# Patient Record
Sex: Female | Born: 1970 | Race: Black or African American | Hispanic: No | Marital: Single | Smoking: Never smoker
Health system: Southern US, Community
[De-identification: ages and names within clinical notes are randomized; demographics above are authoritative.]

## PROBLEM LIST (undated history)

## (undated) DIAGNOSIS — I10 Essential (primary) hypertension: Secondary | ICD-10-CM

---

## 2014-06-24 ENCOUNTER — Ambulatory Visit: Payer: Self-pay | Attending: Internal Medicine | Admitting: Internal Medicine

## 2014-06-24 ENCOUNTER — Encounter: Payer: Self-pay | Admitting: Internal Medicine

## 2014-06-24 VITALS — BP 150/90 | HR 78 | Temp 99.0°F | Resp 16 | Wt 111.4 lb

## 2014-06-24 DIAGNOSIS — R197 Diarrhea, unspecified: Secondary | ICD-10-CM | POA: Insufficient documentation

## 2014-06-24 DIAGNOSIS — R1013 Epigastric pain: Secondary | ICD-10-CM

## 2014-06-24 DIAGNOSIS — Z2821 Immunization not carried out because of patient refusal: Secondary | ICD-10-CM | POA: Insufficient documentation

## 2014-06-24 DIAGNOSIS — R030 Elevated blood-pressure reading, without diagnosis of hypertension: Secondary | ICD-10-CM

## 2014-06-24 DIAGNOSIS — IMO0001 Reserved for inherently not codable concepts without codable children: Secondary | ICD-10-CM | POA: Insufficient documentation

## 2014-06-24 DIAGNOSIS — I10 Essential (primary) hypertension: Secondary | ICD-10-CM | POA: Insufficient documentation

## 2014-06-24 DIAGNOSIS — Z139 Encounter for screening, unspecified: Secondary | ICD-10-CM

## 2014-06-24 DIAGNOSIS — Z8249 Family history of ischemic heart disease and other diseases of the circulatory system: Secondary | ICD-10-CM | POA: Insufficient documentation

## 2014-06-24 LAB — COMPLETE METABOLIC PANEL WITH GFR
ALBUMIN: 4.7 g/dL (ref 3.5–5.2)
ALK PHOS: 59 U/L (ref 39–117)
ALT: 16 U/L (ref 0–35)
AST: 17 U/L (ref 0–37)
BUN: 10 mg/dL (ref 6–23)
CALCIUM: 10 mg/dL (ref 8.4–10.5)
CHLORIDE: 105 meq/L (ref 96–112)
CO2: 25 mEq/L (ref 19–32)
Creat: 0.93 mg/dL (ref 0.50–1.10)
GFR, Est African American: 87 mL/min
GFR, Est Non African American: 76 mL/min
Glucose, Bld: 91 mg/dL (ref 70–99)
POTASSIUM: 4.6 meq/L (ref 3.5–5.3)
SODIUM: 141 meq/L (ref 135–145)
TOTAL PROTEIN: 7.4 g/dL (ref 6.0–8.3)
Total Bilirubin: 0.6 mg/dL (ref 0.2–1.2)

## 2014-06-24 LAB — CBC WITH DIFFERENTIAL/PLATELET
BASOS ABS: 0 10*3/uL (ref 0.0–0.1)
BASOS PCT: 1 % (ref 0–1)
Eosinophils Absolute: 0.1 10*3/uL (ref 0.0–0.7)
Eosinophils Relative: 2 % (ref 0–5)
HEMATOCRIT: 41.1 % (ref 36.0–46.0)
Hemoglobin: 13.2 g/dL (ref 12.0–15.0)
Lymphocytes Relative: 40 % (ref 12–46)
Lymphs Abs: 1.7 10*3/uL (ref 0.7–4.0)
MCH: 26 pg (ref 26.0–34.0)
MCHC: 32.1 g/dL (ref 30.0–36.0)
MCV: 81.1 fL (ref 78.0–100.0)
Monocytes Absolute: 0.5 10*3/uL (ref 0.1–1.0)
Monocytes Relative: 11 % (ref 3–12)
NEUTROS ABS: 1.9 10*3/uL (ref 1.7–7.7)
NEUTROS PCT: 46 % (ref 43–77)
Platelets: 312 10*3/uL (ref 150–400)
RBC: 5.07 MIL/uL (ref 3.87–5.11)
RDW: 14.5 % (ref 11.5–15.5)
WBC: 4.2 10*3/uL (ref 4.0–10.5)

## 2014-06-24 LAB — LIPID PANEL
CHOL/HDL RATIO: 2.2 ratio
Cholesterol: 272 mg/dL — ABNORMAL HIGH (ref 0–200)
HDL: 124 mg/dL (ref >39)
LDL CALC: 138 mg/dL — AB (ref 0–99)
Triglycerides: 49 mg/dL (ref <150)
VLDL: 10 mg/dL (ref 0–40)

## 2014-06-24 LAB — HEMOGLOBIN A1C
HEMOGLOBIN A1C: 5.8 % — AB (ref <5.7)
Mean Plasma Glucose: 120 mg/dL — ABNORMAL HIGH (ref <117)

## 2014-06-24 LAB — TSH: TSH: 0.564 u[IU]/mL (ref 0.350–4.500)

## 2014-06-24 MED ORDER — OMEPRAZOLE 20 MG PO CPDR
20.0000 mg | DELAYED_RELEASE_CAPSULE | Freq: Every day | ORAL | Status: DC
Start: 2014-06-24 — End: 2014-09-19

## 2014-06-24 MED ORDER — ALUMINUM-MAGNESIUM-SIMETHICONE 200-200-20 MG/5ML PO SUSP: 30.0000 mL | Freq: Three times a day (TID) | ORAL | Status: DC

## 2014-06-24 NOTE — Progress Notes (Signed)
Patient here to establish care Complains of stomach pains and diarrhea that started about ten months ago Patient also states she has been loosing weight

## 2014-06-24 NOTE — Progress Notes (Signed)
Patient Demographics  Laura Estes, is a 43 y.o. female  NFA:[id number]:[id number]  ION:[id number]  DOB - 08/14/1971  CC:  Chief Complaint  Patient presents with  . Establish Care       HPI: Laura Estes is a 43 y.o. female here today to establish medical care.Patient reports symptoms of dyspepsia bloating occasional reflux, denies any nausea vomiting has been having some loose bowel movement every day he denies any change in the color denies any fever chills, she has tried over-the-counter medications without much improvement , her blood pressure today is elevated her, she does report family history of hypertension, denies any headache dizziness chest and shortness of breath. As per patient she had a [facility] and reported to be normal. Patient has No headache, No chest pain, No abdominal pain - No Nausea, No new weakness tingling or numbness, No Cough - SOB.  No Known Allergies History reviewed. No pertinent past medical history. No current outpatient prescriptions on file prior to visit.   No current facility-administered medications on file prior to visit.   Family History  Problem Relation Age of Onset  . Cancer Mother   . Hypertension Father   . Heart disease Father    History   Social History  . Marital Status: Single    Spouse Name: N/A    Number of Children: N/A  . Years of Education: N/A   Occupational History  . Not on file.   Social History Main Topics  . Smoking status: Never Smoker   . Smokeless tobacco: Not on file  . Alcohol Use: No  . Drug Use: Not on file  . Sexual Activity: Not on file   Other Topics Concern  . Not on file   Social History Narrative  . No narrative on file    Review of Systems: Constitutional: Negative for fever, chills, diaphoresis, activity change, appetite change and fatigue. HENT: Negative for ear pain, nosebleeds, congestion, facial swelling, rhinorrhea, neck pain, neck stiffness and  ear discharge.  Eyes: Negative for pain, discharge, redness, itching and visual disturbance. Respiratory: Negative for cough, choking, chest tightness, shortness of breath, wheezing and stridor.  Cardiovascular: Negative for chest pain, palpitations and leg swelling. Gastrointestinal: Negative for abdominal distention. Genitourinary: Negative for dysuria, urgency, frequency, hematuria, flank pain, decreased urine volume, difficulty urinating and dyspareunia.  Musculoskeletal: Negative for back pain, joint swelling, arthralgia and gait problem. Neurological: Negative for dizziness, tremors, seizures, syncope, facial asymmetry, speech difficulty, weakness, light-headedness, numbness and headaches.  Hematological: Negative for adenopathy. Does not bruise/bleed easily. Psychiatric/Behavioral: Negative for hallucinations, behavioral problems, confusion, dysphoric mood, decreased concentration and agitation.    Objective:   Filed Vitals:   06/24/14 1107  BP: 150/90  Pulse:   Temp:   Resp:     Physical Exam: Constitutional: Patient appears well-developed and well-nourished. No distress. HENT: Normocephalic, atraumatic, External right and left ear normal. Oropharynx is clear and moist.  Eyes: Conjunctivae and EOM are normal. PERRLA, no scleral icterus. Neck: Normal ROM. Neck supple. No JVD. No tracheal deviation. No thyromegaly. CVS: RRR, S1/S2 +, no murmurs, no gallops, no carotid bruit.  Pulmonary: Effort and breath sounds normal, no stridor, rhonchi, wheezes, rales.  Abdominal: Soft. BS +, no distension, tenderness, rebound or guarding.  Musculoskeletal: Normal range of motion. No edema and no tenderness.  Neuro: Alert. Normal reflexes, muscle tone coordination. No cranial nerve deficit. Skin: Skin is warm and dry. No rash noted. Not diaphoretic. No  erythema. No pallor. Psychiatric: Normal mood and affect. Behavior, judgment, thought content normal.  No results found for: WBC, HGB, HCT,  MCV, PLT No results found for: CREATININE, BUN, NA, K, CL, CO2  No results found for: HGBA1C Lipid Panel  No results found for: CHOL, TRIG, HDL, CHOLHDL, VLDL, LDLCALC     Assessment and plan:   1. Dyspepsia Trial of  - aluminum-magnesium hydroxide-simethicone (MAALOX) 200-200-20 MG/5ML SUSP; Take 30 mLs by mouth 4 (four) times daily -  before meals and at bedtime.  Dispense: 1120 mL; Refill: 1 - omeprazole (PRILOSEC) 20 MG capsule; Take 1 capsule (20 mg total) by mouth daily.  Dispense: 30 capsule; Refill: 3  2. Elevated BP I have advised patient for DASH diet, reevaluate on next visit if persistently elevated consider starting on antihypertensive medications.  3. Screening Ordered baseline blood work.  - CBC with Differential - COMPLETE METABOLIC PANEL WITH GFR - TSH - Lipid panel - Vit D  25 hydroxy (rtn osteoporosis monitoring) - Hemoglobin A1c - MM DIGITAL SCREENING BILATERAL; Future       Health Maintenance  -Pap Smear: up-to-date -Mammogram: ordered  -Vaccinations: Patient declined flu shot.   Return in about 3 months (around 09/24/2014) for Dyspepsia, Elevated BP.   Doris CheadleADVANI, Bena Kobel, MD

## 2014-06-24 NOTE — Patient Instructions (Signed)
DASH Eating Plan °DASH stands for "Dietary Approaches to Stop Hypertension." The DASH eating plan is a healthy eating plan that has been shown to reduce high blood pressure (hypertension). Additional health benefits may include reducing the risk of type 2 diabetes mellitus, heart disease, and stroke. The DASH eating plan may also help with weight loss. °WHAT DO I NEED TO KNOW ABOUT THE DASH EATING PLAN? °For the DASH eating plan, you will follow these general guidelines: °· Choose foods with a percent daily value for sodium of less than 5% (as listed on the food label). °· Use salt-free seasonings or herbs instead of table salt or sea salt. °· Check with your health care provider or pharmacist before using salt substitutes. °· Eat lower-sodium products, often labeled as "lower sodium" or "no salt added." °· Eat fresh foods. °· Eat more vegetables, fruits, and low-fat dairy products. °· Choose whole grains. Look for the word "whole" as the first word in the ingredient list. °· Choose fish and skinless chicken or turkey more often than red meat. Limit fish, poultry, and meat to 6 oz (170 g) each day. °· Limit sweets, desserts, sugars, and sugary drinks. °· Choose heart-healthy fats. °· Limit cheese to 1 oz (28 g) per day. °· Eat more home-cooked food and less restaurant, buffet, and fast food. °· Limit fried foods. °· Cook foods using methods other than frying. °· Limit canned vegetables. If you do use them, rinse them well to decrease the sodium. °· When eating at a restaurant, ask that your food be prepared with less salt, or no salt if possible. °WHAT FOODS CAN I EAT? °Seek help from a dietitian for individual calorie needs. °Grains °Whole grain or whole wheat bread. Brown rice. Whole grain or whole wheat pasta. Quinoa, bulgur, and whole grain cereals. Low-sodium cereals. Corn or whole wheat flour tortillas. Whole grain cornbread. Whole grain crackers. Low-sodium crackers. °Vegetables °Fresh or frozen vegetables  (raw, steamed, roasted, or grilled). Low-sodium or reduced-sodium tomato and vegetable juices. Low-sodium or reduced-sodium tomato sauce and paste. Low-sodium or reduced-sodium canned vegetables.  °Fruits °All fresh, canned (in natural juice), or frozen fruits. °Meat and Other Protein Products °Ground beef (85% or leaner), grass-fed beef, or beef trimmed of fat. Skinless chicken or turkey. Ground chicken or turkey. Pork trimmed of fat. All fish and seafood. Eggs. Dried beans, peas, or lentils. Unsalted nuts and seeds. Unsalted canned beans. °Dairy °Low-fat dairy products, such as skim or 1% milk, 2% or reduced-fat cheeses, low-fat ricotta or cottage cheese, or plain low-fat yogurt. Low-sodium or reduced-sodium cheeses. °Fats and Oils °Tub margarines without trans fats. Light or reduced-fat mayonnaise and salad dressings (reduced sodium). Avocado. Safflower, olive, or canola oils. Natural peanut or almond butter. °Other °Unsalted popcorn and pretzels. °The items listed above may not be a complete list of recommended foods or beverages. Contact your dietitian for more options. °WHAT FOODS ARE NOT RECOMMENDED? °Grains °White bread. White pasta. White rice. Refined cornbread. Bagels and croissants. Crackers that contain trans fat. °Vegetables °Creamed or fried vegetables. Vegetables in a cheese sauce. Regular canned vegetables. Regular canned tomato sauce and paste. Regular tomato and vegetable juices. °Fruits °Dried fruits. Canned fruit in light or heavy syrup. Fruit juice. °Meat and Other Protein Products °Fatty cuts of meat. Ribs, chicken wings, bacon, sausage, bologna, salami, chitterlings, fatback, hot dogs, bratwurst, and packaged luncheon meats. Salted nuts and seeds. Canned beans with salt. °Dairy °Whole or 2% milk, cream, half-and-half, and cream cheese. Whole-fat or sweetened yogurt. Full-fat   cheeses or blue cheese. Nondairy creamers and whipped toppings. Processed cheese, cheese spreads, or cheese  curds. °Condiments °Onion and garlic salt, seasoned salt, table salt, and sea salt. Canned and packaged gravies. Worcestershire sauce. Tartar sauce. Barbecue sauce. Teriyaki sauce. Soy sauce, including reduced sodium. Steak sauce. Fish sauce. Oyster sauce. Cocktail sauce. Horseradish. Ketchup and mustard. Meat flavorings and tenderizers. Bouillon cubes. Hot sauce. Tabasco sauce. Marinades. Taco seasonings. Relishes. °Fats and Oils °Butter, stick margarine, lard, shortening, ghee, and bacon fat. Coconut, palm kernel, or palm oils. Regular salad dressings. °Other °Pickles and olives. Salted popcorn and pretzels. °The items listed above may not be a complete list of foods and beverages to avoid. Contact your dietitian for more information. °WHERE CAN I FIND MORE INFORMATION? °National [employer], [employer], and [employer] Institute: www.nhlbi.nih.gov/health/health-topics/topics/dash/ °Document Released: 07/22/2011 Document Revised: 12/17/2013 Document Reviewed: 06/06/2013 °ExitCare® Patient Information ©2015 [employer]. This information is not intended to replace advice given to you by your health care provider. Make sure you discuss any questions you have with your health care provider. ° °

## 2014-06-24 NOTE — Progress Notes (Deleted)
Patient Demographics  Laura Estes Stegner, is a 43 y.o. female  EPP:[id number]:[id number]  [id number]  DOB - 04/02/1971  CC:  Chief Complaint  Patient presents with  . Establish Care       HPI: Laura Estes Guion is a 43 y.o. female here today to establish medical care. Patient has No headache, No chest pain, No abdominal pain - No Nausea, No new weakness tingling or numbness, No Cough - SOB.  Not on File History reviewed. No pertinent past medical history. No current outpatient prescriptions on file prior to visit.   No current facility-administered medications on file prior to visit.   Family History  Problem Relation Age of Onset  . Cancer Mother   . Hypertension Father   . Heart disease Father    History   Social History  . Marital Status: Single    Spouse Name: N/A    Number of Children: N/A  . Years of Education: N/A   Occupational History  . Not on file.   Social History Main Topics  . Smoking status: Never Smoker   . Smokeless tobacco: Not on file  . Alcohol Use: No  . Drug Use: Not on file  . Sexual Activity: Not on file   Other Topics Concern  . Not on file   Social History Narrative  . No narrative on file    Review of Systems: Constitutional: Negative for fever, chills, diaphoresis, activity change, appetite change and fatigue. HENT: Negative for ear pain, nosebleeds, congestion, facial swelling, rhinorrhea, neck pain, neck stiffness and ear discharge.  Eyes: Negative for pain, discharge, redness, itching and visual disturbance. Respiratory: Negative for cough, choking, chest tightness, shortness of breath, wheezing and stridor.  Cardiovascular: Negative for chest pain, palpitations and leg swelling. Gastrointestinal: Negative for abdominal distention. Genitourinary: Negative for dysuria, urgency, frequency, hematuria, flank pain, decreased urine volume, difficulty urinating and dyspareunia.  Musculoskeletal: Negative for back pain, joint swelling,  arthralgia and gait problem. Neurological: Negative for dizziness, tremors, seizures, syncope, facial asymmetry, speech difficulty, weakness, light-headedness, numbness and headaches.  Hematological: Negative for adenopathy. Does not bruise/bleed easily. Psychiatric/Behavioral: Negative for hallucinations, behavioral problems, confusion, dysphoric mood, decreased concentration and agitation.    Objective:   Filed Vitals:   06/24/14 1107  BP: 150/90  Pulse:   Temp:   Resp:     Physical Exam: Constitutional: Patient appears well-developed and well-nourished. No distress. HENT: Normocephalic, atraumatic, External right and left ear normal. Oropharynx is clear and moist.  Eyes: Conjunctivae and EOM are normal. PERRLA, no scleral icterus. Neck: Normal ROM. Neck supple. No JVD. No tracheal deviation. No thyromegaly. CVS: RRR, S1/S2 +, no murmurs, no gallops, no carotid bruit.  Pulmonary: Effort and breath sounds normal, no stridor, rhonchi, wheezes, rales.  Abdominal: Soft. BS +, no distension, tenderness, rebound or guarding.  Musculoskeletal: Normal range of motion. No edema and no tenderness.  Lymphadenopathy: No lymphadenopathy noted, cervical, inguinal or axillary Neuro: Alert. Normal reflexes, muscle tone coordination. No cranial nerve deficit. Skin: Skin is warm and dry. No rash noted. Not diaphoretic. No erythema. No pallor. Psychiatric: Normal mood and affect. Behavior, judgment, thought content normal.  No results found for: WBC, HGB, HCT, MCV, PLT No results found for: CREATININE, BUN, NA, K, CL, CO2  No results found for: HGBA1C Lipid Panel  No results found for: CHOL, TRIG, HDL, CHOLHDL, VLDL, LDLCALC     Assessment and plan:   1. Dyspepsia ***  2. Elevated BP ***  3. Screening ***  Health Maintenance  -Pap Smear: uptodate done 2015  -Mammogram: ordered  -Vaccinations:  Patient declines flu shot    Return in about 3 months (around 09/24/2014) for  Dyspepsia.    The patient was given clear instructions to go to ER or return to medical center if symptoms don't improve, worsen or new problems develop. The patient verbalized understanding. The patient was told to call to get lab results if they haven't heard anything in the next week.     Doris CheadleADVANI, Justis Closser, MD

## 2014-06-25 LAB — VITAMIN D 25 HYDROXY (VIT D DEFICIENCY, FRACTURES): VIT D 25 HYDROXY: 28 ng/mL — AB (ref 30–89)

## 2014-06-27 ENCOUNTER — Telehealth: Payer: Self-pay

## 2014-06-27 NOTE — Telephone Encounter (Signed)
Patient is aware of her lab results 

## 2014-06-27 NOTE — Telephone Encounter (Signed)
-----   Message from Doris Cheadleeepak Advani, MD sent at 06/25/2014  9:28 AM EST ----- Blood work reviewed, noticed low vitamin D, advise patient to start taking OTC 2000 units daily. Also noticed impaired fasting glucose with A1c of 5.8% and elevated cholesterol, advise patient for low carbohydrate and low-fat diet.

## 2014-08-05 ENCOUNTER — Ambulatory Visit: Payer: Self-pay

## 2014-08-19 ENCOUNTER — Ambulatory Visit (HOSPITAL_COMMUNITY)
Admission: RE | Admit: 2014-08-19 | Discharge: 2014-08-19 | Disposition: A | Discharge status: Home / Self Care | Payer: Self-pay | Source: Ambulatory Visit | Attending: Internal Medicine | Admitting: Internal Medicine

## 2014-08-19 DIAGNOSIS — Z139 Encounter for screening, unspecified: Secondary | ICD-10-CM

## 2014-08-21 ENCOUNTER — Other Ambulatory Visit: Payer: Self-pay | Admitting: Internal Medicine

## 2014-08-21 DIAGNOSIS — R928 Other abnormal and inconclusive findings on diagnostic imaging of breast: Secondary | ICD-10-CM

## 2014-09-12 ENCOUNTER — Encounter (HOSPITAL_COMMUNITY): Payer: Self-pay

## 2014-09-12 ENCOUNTER — Ambulatory Visit
Admission: RE | Admit: 2014-09-12 | Discharge: 2014-09-12 | Disposition: A | Discharge status: Home / Self Care | Payer: No Typology Code available for payment source | Source: Ambulatory Visit | Attending: Internal Medicine | Admitting: Internal Medicine

## 2014-09-12 ENCOUNTER — Ambulatory Visit (HOSPITAL_COMMUNITY)
Admission: RE | Admit: 2014-09-12 | Discharge: 2014-09-12 | Disposition: A | Discharge status: Home / Self Care | Payer: Self-pay | Source: Ambulatory Visit | Attending: Obstetrics and Gynecology | Admitting: Obstetrics and Gynecology

## 2014-09-12 VITALS — BP 108/62 | Temp 98.9°F | Ht 61.0 in | Wt 108.2 lb

## 2014-09-12 DIAGNOSIS — R928 Other abnormal and inconclusive findings on diagnostic imaging of breast: Secondary | ICD-10-CM

## 2014-09-12 DIAGNOSIS — Z1239 Encounter for other screening for malignant neoplasm of breast: Secondary | ICD-10-CM

## 2014-09-12 HISTORY — DX: Essential (primary) hypertension: I10

## 2014-09-12 IMAGING — MG MM DIAGNOSTIC UNILATERAL L
2 series · 2 of 2 positions shown · non-contrast
Comparison: [DATE]

CLINICAL DATA: Call back from screening for left breast
calcifications. Patient screening mammogram was her baseline study.

EXAM:
DIGITAL DIAGNOSTIC  LEFT MAMMOGRAM

[L CC]
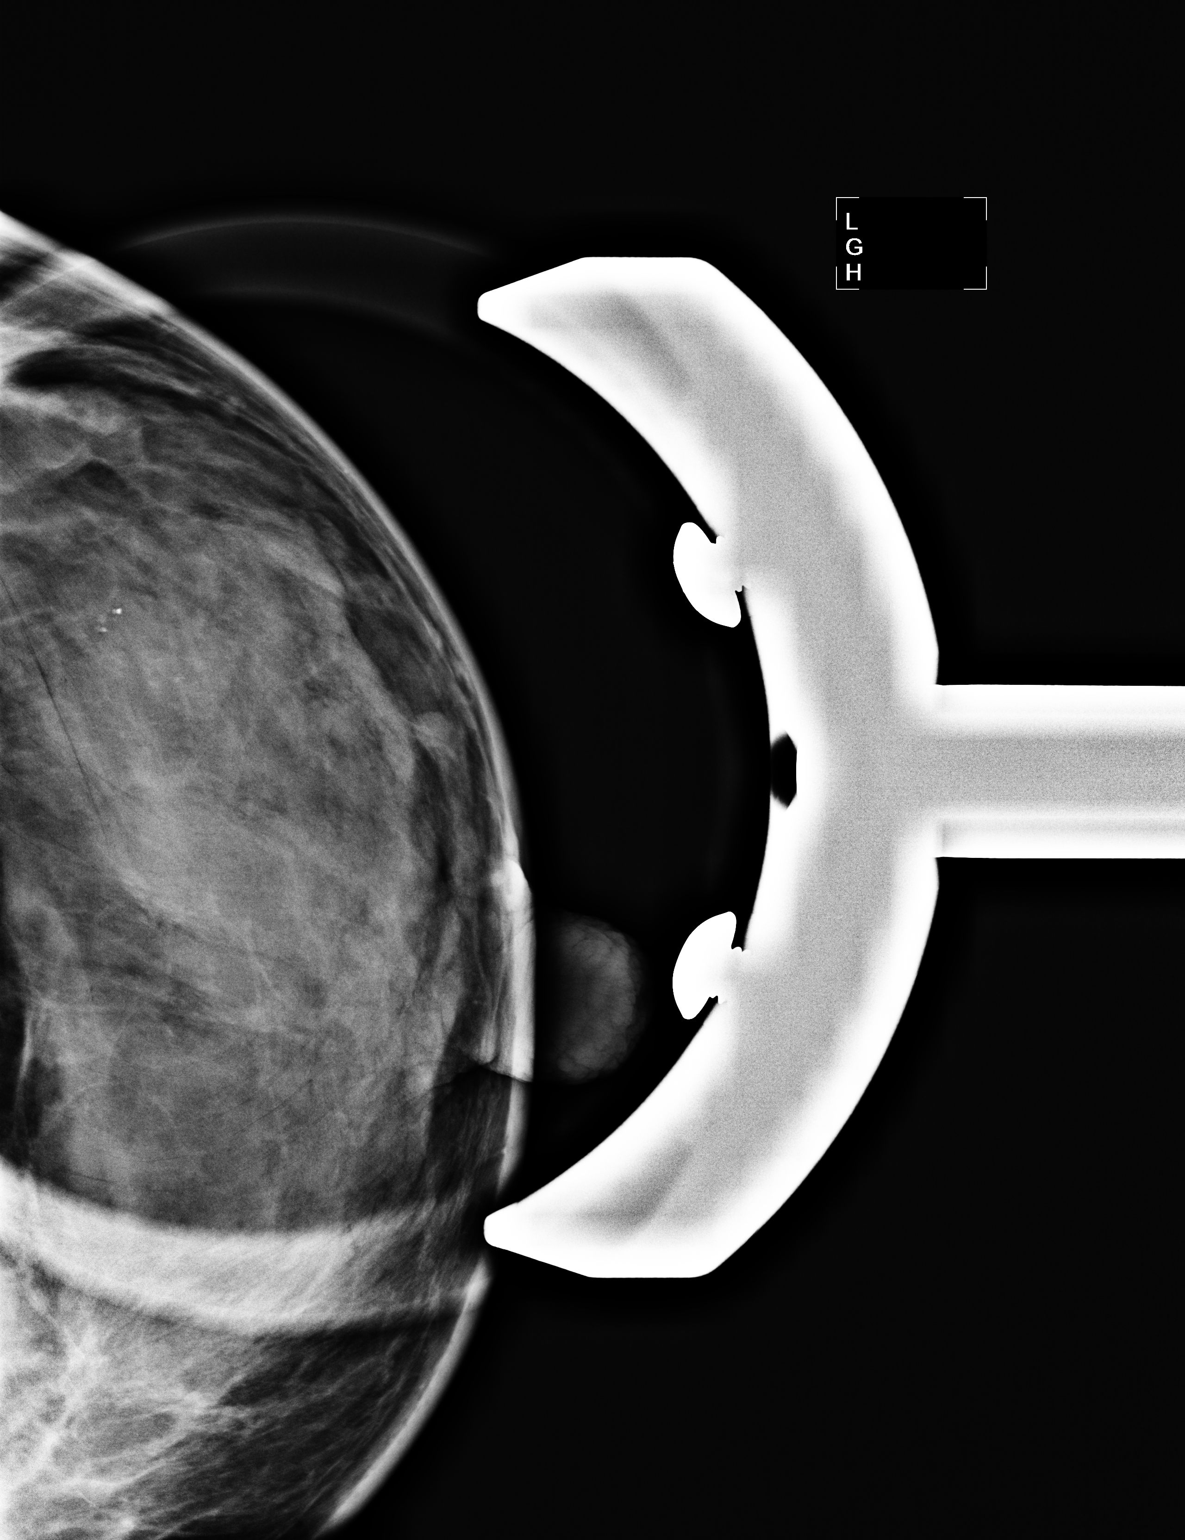

[L ML]
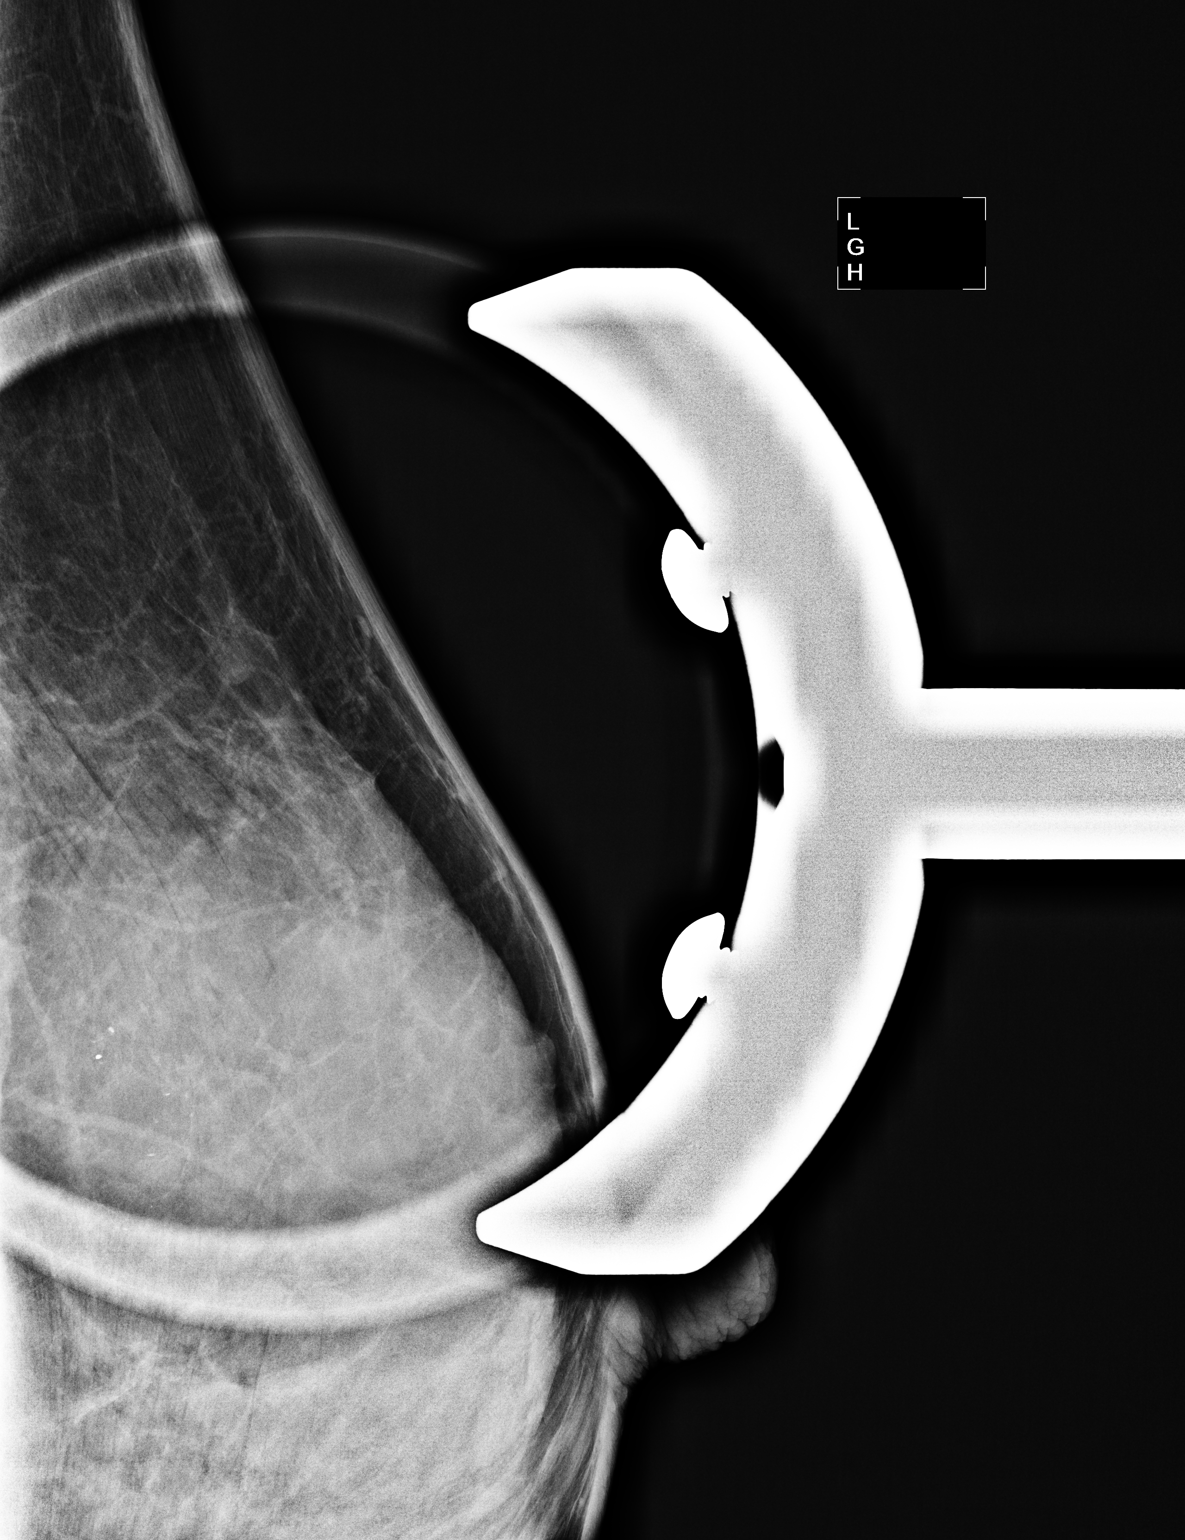

[2 of 2 positions shown; findings below may reference images not displayed]

ACR Breast Density Category c: The breast tissue is heterogeneously
dense, which may obscure small masses.
FINDINGS: On the magnification CC view, most of the calcifications are smudgy
and not well seen. On the ML view, most of the calcifications appear
to layer consistent with mild calcium. Two discrete calcifications
persist, which have typical round smooth benign appearances. These
calcifications are all felt to be benign needing no additional
specific evaluation.
IMPRESSION: Benign left breast calcifications.  No evidence of malignancy.

RECOMMENDATION:
Screening mammogram in one year.(Code:[9B])

I have discussed the findings and recommendations with the patient.
Results were also provided in writing at the conclusion of the
visit. If applicable, a reminder letter will be sent to the patient
regarding the next appointment.

BI-RADS CATEGORY  2: Benign.

## 2014-09-12 NOTE — Patient Instructions (Signed)
Explained to [name] that she did not need a Pap smear today due to last Pap smear was in February 2015 per patient. Let her know [facility] will cover Pap smears every 3 years unless has a history of abnormal Pap smears. [facility] per recommendation. Appointment scheduled for Thursday, September 12, 2014 at 1545. Patient aware of appointment and will be there.  [name] verbalized understanding.  [place], [name], RN 3:02 PM

## 2014-09-12 NOTE — Progress Notes (Signed)
[facility] due to recommending additional imaging of the left breast. Screening mamm[facility].  Pap Smear:  Pap smear not completed today. [facility] and normal per patient. Per patient has no history of an abnormal Pap smear. No Pap smear results in EPIC.  Physical exam: Breasts Breasts symmetrical. No skin abnormalities bilateral breasts. No nipple retraction bilateral breasts. No nipple discharge bilateral breasts. No lymphadenopathy. No lumps palpated bilateral breasts. No complaints of pain or tenderness on exam. [facility] breast diagnostic mamm[facility] per recommendation. Appointment scheduled for Thursday, September 12, 2014 at 1545.  Pelvic/Bimanual No Pap smear completed today since last Pap smear was in February 2015 per patient. Pap smear not indicated per [facility] guidelines.

## 2014-09-19 ENCOUNTER — Ambulatory Visit: Payer: Self-pay | Attending: Internal Medicine | Admitting: Internal Medicine

## 2014-09-19 ENCOUNTER — Encounter: Payer: Self-pay | Admitting: Gastroenterology

## 2014-09-19 ENCOUNTER — Encounter: Payer: Self-pay | Admitting: Internal Medicine

## 2014-09-19 VITALS — BP 130/80 | HR 89 | Temp 98.0°F | Resp 16 | Wt 108.8 lb

## 2014-09-19 DIAGNOSIS — Z79899 Other long term (current) drug therapy: Secondary | ICD-10-CM | POA: Insufficient documentation

## 2014-09-19 DIAGNOSIS — R7301 Impaired fasting glucose: Secondary | ICD-10-CM | POA: Insufficient documentation

## 2014-09-19 DIAGNOSIS — E7800 Pure hypercholesterolemia, unspecified: Secondary | ICD-10-CM

## 2014-09-19 DIAGNOSIS — E559 Vitamin D deficiency, unspecified: Secondary | ICD-10-CM | POA: Insufficient documentation

## 2014-09-19 DIAGNOSIS — R1013 Epigastric pain: Secondary | ICD-10-CM | POA: Insufficient documentation

## 2014-09-19 DIAGNOSIS — R634 Abnormal weight loss: Secondary | ICD-10-CM | POA: Insufficient documentation

## 2014-09-19 MED ORDER — OMEPRAZOLE 40 MG PO CPDR: 40.0000 mg | DELAYED_RELEASE_CAPSULE | Freq: Every day | ORAL | Status: DC

## 2014-09-19 NOTE — Patient Instructions (Addendum)
Diabetes Mellitus and Food It is important for you to manage your blood sugar (glucose) level. Your blood glucose level can be greatly affected by what you eat. Eating healthier foods in the appropriate amounts throughout the day at about the same time each day will help you control your blood glucose level. It can also help slow or prevent worsening of your diabetes mellitus. Healthy eating may even help you improve the level of your blood pressure and reach or maintain a healthy weight.  HOW CAN FOOD AFFECT ME? Carbohydrates Carbohydrates affect your blood glucose level more than any other type of food. Your dietitian will help you determine how many carbohydrates to eat at each meal and teach you how to count carbohydrates. Counting carbohydrates is important to keep your blood glucose at a healthy level, especially if you are using insulin or taking certain medicines for diabetes mellitus. Alcohol Alcohol can cause sudden decreases in blood glucose (hypoglycemia), especially if you use insulin or take certain medicines for diabetes mellitus. Hypoglycemia can be a life-threatening condition. Symptoms of hypoglycemia (sleepiness, dizziness, and disorientation) are similar to symptoms of having too much alcohol.  If your health care provider has given you approval to drink alcohol, do so in moderation and use the following guidelines:  Women should not have more than one drink per day, and men should not have more than two drinks per day. One drink is equal to:  12 oz of beer.  5 oz of wine.  1 oz of hard liquor.  Do not drink on an empty stomach.  Keep yourself hydrated. Have water, diet soda, or unsweetened iced tea.  Regular soda, juice, and other mixers might contain a lot of carbohydrates and should be counted. WHAT FOODS ARE NOT RECOMMENDED? As you make food choices, it is important to remember that all foods are not the same. Some foods have fewer nutrients per serving than other  foods, even though they might have the same number of calories or carbohydrates. It is difficult to get your body what it needs when you eat foods with fewer nutrients. Examples of foods that you should avoid that are high in calories and carbohydrates but low in nutrients include:  Trans fats (most processed foods list trans fats on the Nutrition Facts label).  Regular soda.  Juice.  Candy.  Sweets, such as cake, pie, doughnuts, and cookies.  Fried foods. WHAT FOODS CAN I EAT? Have nutrient-rich foods, which will nourish your body and keep you healthy. The food you should eat also will depend on several factors, including:  The calories you need.  The medicines you take.  Your weight.  Your blood glucose level.  Your blood pressure level.  Your cholesterol level. You also should eat a variety of foods, including:  Protein, such as meat, poultry, fish, tofu, nuts, and seeds (lean animal proteins are best).  Fruits.  Vegetables.  Dairy products, such as milk, cheese, and yogurt (low fat is best).  Breads, grains, pasta, cereal, rice, and beans.  Fats such as olive oil, trans fat-free margarine, canola oil, avocado, and olives. DOES EVERYONE WITH DIABETES MELLITUS HAVE THE SAME MEAL PLAN? Because every person with diabetes mellitus is different, there is not one meal plan that works for everyone. It is very important that you meet with a dietitian who will help you create a meal plan that is just right for you. Document Released: 04/29/2005 Document Revised: 08/07/2013 Document Reviewed: 06/29/2013 Exit[employer] Patient Information 2015 [employer]. This   information is not intended to replace advice given to you by your health care provider. Make sure you discuss any questions you have with your health care provider. Fat and Cholesterol Control Diet Fat and cholesterol levels in your blood and organs are influenced by your diet. High levels of fat and cholesterol may lead to  diseases of the heart, small and large blood vessels, gallbladder, liver, and pancreas. CONTROLLING FAT AND CHOLESTEROL WITH DIET Although exercise and lifestyle factors are important, your diet is key. That is because certain foods are known to raise cholesterol and others to lower it. The goal is to balance foods for their effect on cholesterol and more importantly, to replace saturated and trans fat with other types of fat, such as monounsaturated fat, polyunsaturated fat, and omega-3 fatty acids. On average, a person should consume no more than 15 to 17 g of saturated fat daily. Saturated and trans fats are considered "bad" fats, and they will raise LDL cholesterol. Saturated fats are primarily found in animal products such as meats, butter, and cream. However, that does not mean you need to give up all your favorite foods. Today, there are good tasting, low-fat, low-cholesterol substitutes for most of the things you like to eat. Choose low-fat or nonfat alternatives. Choose round or loin cuts of red meat. These types of cuts are lowest in fat and cholesterol. Chicken (without the skin), fish, veal, and ground turkey breast are great choices. Eliminate fatty meats, such as hot dogs and salami. Even shellfish have little or no saturated fat. Have a 3 oz (85 g) portion when you eat lean meat, poultry, or fish. Trans fats are also called "partially hydrogenated oils." They are oils that have been scientifically manipulated so that they are solid at room temperature resulting in a longer shelf life and improved taste and texture of foods in which they are added. Trans fats are found in stick margarine, some tub margarines, cookies, crackers, and baked goods.  When baking and cooking, oils are a great substitute for butter. The monounsaturated oils are especially beneficial since it is believed they lower LDL and raise HDL. The oils you should avoid entirely are saturated tropical oils, such as coconut and palm.   Remember to eat a lot from food groups that are naturally free of saturated and trans fat, including fish, fruit, vegetables, beans, grains (barley, rice, couscous, bulgur wheat), and pasta (without cream sauces).  IDENTIFYING FOODS THAT LOWER FAT AND CHOLESTEROL  Soluble fiber may lower your cholesterol. This type of fiber is found in fruits such as apples, vegetables such as broccoli, potatoes, and carrots, legumes such as beans, peas, and lentils, and grains such as barley. Foods fortified with plant sterols (phytosterol) may also lower cholesterol. You should eat at least 2 g per day of these foods for a cholesterol lowering effect.  Read package labels to identify low-saturated fats, trans fat free, and low-fat foods at the supermarket. Select cheeses that have only 2 to 3 g saturated fat per ounce. Use a heart-healthy tub margarine that is free of trans fats or partially hydrogenated oil. When buying baked goods (cookies, crackers), avoid partially hydrogenated oils. Breads and muffins should be made from whole grains (whole-wheat or whole oat flour, instead of "flour" or "enriched flour"). Buy non-creamy canned soups with reduced salt and no added fats.  FOOD PREPARATION TECHNIQUES  Never deep-fry. If you must fry, either stir-fry, which uses very little fat, or use non-stick cooking sprays. When possible, broil,   bake, or roast meats, and steam vegetables. Instead of putting butter or margarine on vegetables, use lemon and herbs, applesauce, and cinnamon (for squash and sweet potatoes). Use nonfat yogurt, salsa, and low-fat dressings for salads.  LOW-SATURATED FAT / LOW-FAT FOOD SUBSTITUTES Meats / Saturated Fat (g)  Avoid: Steak, marbled (3 oz/85 g) / 11 g  Choose: Steak, lean (3 oz/85 g) / 4 g  Avoid: Hamburger (3 oz/85 g) / 7 g  Choose: Hamburger, lean (3 oz/85 g) / 5 g  Avoid: Ham (3 oz/85 g) / 6 g  Choose: Ham, lean cut (3 oz/85 g) / 2.4 g  Avoid: Chicken, with skin, dark meat (3  oz/85 g) / 4 g  Choose: Chicken, skin removed, dark meat (3 oz/85 g) / 2 g  Avoid: Chicken, with skin, light meat (3 oz/85 g) / 2.5 g  Choose: Chicken, skin removed, light meat (3 oz/85 g) / 1 g Dairy / Saturated Fat (g)  Avoid: Whole milk (1 cup) / 5 g  Choose: Low-fat milk, 2% (1 cup) / 3 g  Choose: Low-fat milk, 1% (1 cup) / 1.5 g  Choose: Skim milk (1 cup) / 0.3 g  Avoid: Hard cheese (1 oz/28 g) / 6 g  Choose: Skim milk cheese (1 oz/28 g) / 2 to 3 g  Avoid: Cottage cheese, 4% fat (1 cup) / 6.5 g  Choose: Low-fat cottage cheese, 1% fat (1 cup) / 1.5 g  Avoid: Ice cream (1 cup) / 9 g  Choose: Sherbet (1 cup) / 2.5 g  Choose: Nonfat frozen yogurt (1 cup) / 0.3 g  Choose: Frozen fruit bar / trace  Avoid: Whipped cream (1 tbs) / 3.5 g  Choose: Nondairy whipped topping (1 tbs) / 1 g Condiments / Saturated Fat (g)  Avoid: Mayonnaise (1 tbs) / 2 g  Choose: Low-fat mayonnaise (1 tbs) / 1 g  Avoid: Butter (1 tbs) / 7 g  Choose: Extra light margarine (1 tbs) / 1 g  Avoid: Coconut oil (1 tbs) / 11.8 g  Choose: Olive oil (1 tbs) / 1.8 g  Choose: Corn oil (1 tbs) / 1.7 g  Choose: Safflower oil (1 tbs) / 1.2 g  Choose: Sunflower oil (1 tbs) / 1.4 g  Choose: Soybean oil (1 tbs) / 2.4 g  Choose: Canola oil (1 tbs) / 1 g Document Released: 08/02/2005 Document Revised: 11/27/2012 Document Reviewed: 10/31/2013 Exit[employer] Patient Information 2015 [employer]. This information is not intended to replace advice given to you by your health care provider. Make sure you discuss any questions you have with your health care provider.  

## 2014-09-19 NOTE — Progress Notes (Signed)
Patient here for follow up Patient concerned she is loosing weight Still having loose bowels and stomach is still bothering her

## 2014-09-19 NOTE — Progress Notes (Signed)
MRN: [id number] Name: Laura Estes  Sex: female Age: 44 y.o. DOB: 03/13/1971  Allergies: Review of patient's allergies indicates no known allergies.  Chief Complaint  Patient presents with  . Follow-up    HPI: Patient is 44 y.o. female who her history of GERD/dyspepsia comes today complaining of symptoms not improved since she was prescribed Maalox and Prilosec 20 mg on the last visit, she does report weight loss, her bowel movements have always been soft, previous blood work reviewed with the patient noticed impaired fasting glucose and elevated cholesterol as well as vitamin D deficiency. Initially today her blood pressure was elevated, repeat manual blood pressure is 130/80. Patient has been trying to modify her diet and low salt.  Past Medical History  Diagnosis Date  . Hypertension     History reviewed. No pertinent past surgical history.    Medication List       This list is accurate as of: 09/19/14  9:51 AM.  Always use your most recent med list.               aluminum-magnesium hydroxide-simethicone 200-200-20 MG/5ML Susp  Commonly known as:  MAALOX  Take 30 mLs by mouth 4 (four) times daily -  before meals and at bedtime.     omeprazole 40 MG capsule  Commonly known as:  PRILOSEC  Take 1 capsule (40 mg total) by mouth daily.        Meds ordered this encounter  Medications  . omeprazole (PRILOSEC) 40 MG capsule    Sig: Take 1 capsule (40 mg total) by mouth daily.    Dispense:  30 capsule    Refill:  3     There is no immunization history on file for this patient.  Family History  Problem Relation Age of Onset  . Cancer Mother     lung  . Hypertension Father   . Heart disease Father   . Cancer Maternal Grandmother     ovarian and uterine    History  Substance Use Topics  . Smoking status: Never Smoker   . Smokeless tobacco: Never Used  . Alcohol Use: No    Review of Systems   As noted in HPI  Filed Vitals:   09/19/14 0941  BP:  130/80  Pulse:   Temp:   Resp:     Physical Exam  Physical Exam  Constitutional: No distress.  Eyes: EOM are normal. Pupils are equal, round, and reactive to light.  Cardiovascular: Normal rate and regular rhythm.   Pulmonary/Chest: Breath sounds normal. No respiratory distress. She has no wheezes. She has no rales.  Abdominal: She exhibits no distension. There is no tenderness. There is no rebound.  No CVA tenderness   Musculoskeletal: She exhibits no edema.    CBC    Component Value Date/Time   WBC 4.2 06/24/2014 1115   RBC 5.07 06/24/2014 1115   HGB 13.2 06/24/2014 1115   HCT 41.1 06/24/2014 1115   PLT 312 06/24/2014 1115   MCV 81.1 06/24/2014 1115   LYMPHSABS 1.7 06/24/2014 1115   MONOABS 0.5 06/24/2014 1115   EOSABS 0.1 06/24/2014 1115   BASOSABS 0.0 06/24/2014 1115    CMP     Component Value Date/Time   NA 141 06/24/2014 1115   K 4.6 06/24/2014 1115   CL 105 06/24/2014 1115   CO2 25 06/24/2014 1115   GLUCOSE 91 06/24/2014 1115   BUN 10 06/24/2014 1115   CREATININE 0.93 06/24/2014 1115   CALCIUM  10.0 06/24/2014 1115   PROT 7.4 06/24/2014 1115   ALBUMIN 4.7 06/24/2014 1115   AST 17 06/24/2014 1115   ALT 16 06/24/2014 1115   ALKPHOS 59 06/24/2014 1115   BILITOT 0.6 06/24/2014 1115   GFRNONAA 76 06/24/2014 1115   GFRAA 87 06/24/2014 1115    Lab Results  Component Value Date/Time   CHOL 272* 06/24/2014 11:15 AM    No components found for: HGA1C  Lab Results  Component Value Date/Time   AST 17 06/24/2014 11:15 AM    Assessment and Plan  Dyspepsia /Loss of weight - Plan: have increased the dose of Prilosec omeprazole (PRILOSEC) 40 MG capsule, US Abdomen Complete, Ambulatory referral to Gastroenterology  IFG (impaired fasting glucose) Advised patient for low carbohydrate diet.  High cholesterol Advised patient for low fat diet.  Vitamin D insufficiency Patient will start taking over-the-counter vitamin D 2000 units daily.  Health  Maintenance  -Pap Smear: will be scheduled  -Mammogram: uptodate  -Vaccinations:  -patient declines flu shot   Return in about 3 months (around 12/18/2014), or if symptoms worsen or fail to improve, for gerd, Schedule Appt with Dr Glendora Score for PAP.  Doris Cheadle, MD

## 2014-09-26 ENCOUNTER — Ambulatory Visit: Payer: Self-pay | Admitting: Family Medicine

## 2014-09-26 ENCOUNTER — Encounter: Payer: Self-pay | Admitting: Family Medicine

## 2014-09-26 ENCOUNTER — Other Ambulatory Visit (HOSPITAL_COMMUNITY)
Admission: RE | Admit: 2014-09-26 | Discharge: 2014-09-26 | Disposition: A | Discharge status: Home / Self Care | Payer: Self-pay | Source: Ambulatory Visit | Attending: Family Medicine | Admitting: Family Medicine

## 2014-09-26 ENCOUNTER — Ambulatory Visit: Payer: Self-pay | Attending: Family Medicine | Admitting: Family Medicine

## 2014-09-26 ENCOUNTER — Ambulatory Visit (HOSPITAL_COMMUNITY)
Admission: RE | Admit: 2014-09-26 | Discharge: 2014-09-26 | Disposition: A | Discharge status: Home / Self Care | Payer: Self-pay | Source: Ambulatory Visit | Attending: Internal Medicine | Admitting: Internal Medicine

## 2014-09-26 VITALS — BP 121/75 | HR 96 | Temp 98.0°F | Resp 16 | Ht 61.0 in | Wt 109.0 lb

## 2014-09-26 DIAGNOSIS — N760 Acute vaginitis: Secondary | ICD-10-CM | POA: Insufficient documentation

## 2014-09-26 DIAGNOSIS — R1013 Epigastric pain: Secondary | ICD-10-CM | POA: Insufficient documentation

## 2014-09-26 DIAGNOSIS — R1030 Lower abdominal pain, unspecified: Secondary | ICD-10-CM

## 2014-09-26 DIAGNOSIS — N951 Menopausal and female climacteric states: Secondary | ICD-10-CM | POA: Insufficient documentation

## 2014-09-26 DIAGNOSIS — Z1151 Encounter for screening for human papillomavirus (HPV): Secondary | ICD-10-CM | POA: Insufficient documentation

## 2014-09-26 DIAGNOSIS — Z113 Encounter for screening for infections with a predominantly sexual mode of transmission: Secondary | ICD-10-CM | POA: Insufficient documentation

## 2014-09-26 DIAGNOSIS — R232 Flushing: Secondary | ICD-10-CM | POA: Insufficient documentation

## 2014-09-26 DIAGNOSIS — L509 Urticaria, unspecified: Secondary | ICD-10-CM | POA: Insufficient documentation

## 2014-09-26 DIAGNOSIS — Z124 Encounter for screening for malignant neoplasm of cervix: Secondary | ICD-10-CM | POA: Insufficient documentation

## 2014-09-26 DIAGNOSIS — N912 Amenorrhea, unspecified: Secondary | ICD-10-CM | POA: Insufficient documentation

## 2014-09-26 DIAGNOSIS — B9689 Other specified bacterial agents as the cause of diseases classified elsewhere: Secondary | ICD-10-CM

## 2014-09-26 DIAGNOSIS — A499 Bacterial infection, unspecified: Secondary | ICD-10-CM

## 2014-09-26 DIAGNOSIS — Z01419 Encounter for gynecological examination (general) (routine) without abnormal findings: Secondary | ICD-10-CM | POA: Insufficient documentation

## 2014-09-26 LAB — POCT URINE PREGNANCY: PREG TEST UR: NEGATIVE

## 2014-09-26 MED ORDER — LORATADINE 10 MG PO TABS: 10.0000 mg | ORAL_TABLET | Freq: Every day | ORAL | Status: DC

## 2014-09-26 NOTE — Progress Notes (Signed)
   Subjective:    Patient ID: Laura DakinKiki Estes, female    DOB: 05/04/1971, 44 y.o.   MRN: [id number] CC: Here for Pap smear PCP Dr. Valrie HartAdavani  HPI 44 year old female:  1. Pap smear: No history of abnormal periods. She denies vaginal discharge bleeding. She's no longer having menstrual periods. She is having hot flashes and would like some information about treatment for hot flashes.  2. Health maintenance: Patient recently had a normal screening mammogram.  Soc Hx: non smoker   Review of Systems As per HPI  Hot flashes    Objective:   Physical Exam BP 121/75 mmHg  Pulse 96  Temp(Src) 98 F (36.7 C)  Resp 16  Ht 5\' 1"  (1.549 m)  Wt 109 lb (49.442 kg)  BMI 20.61 kg/m2  SpO2 99% General appearance: alert, cooperative and no distress  Skin: cluster of erythematous papules on L collar bone. Sweating noted.  Breasts: normal appearance, no masses or tenderness, Inspection negative, No nipple retraction or dimpling, No nipple discharge or bleeding, No axillary or supraclavicular adenopathy Abdomen: soft flat, non tender Pelvic: cervix normal in appearance, external genitalia normal, no adnexal masses or tenderness, no cervical motion tenderness, rectovaginal septum normal, uterus normal size, shape, and consistency and vagina normal without discharge   Vaginal bleeding noted while performing pap smear   U preg: Negative     Assessment & Plan:

## 2014-09-26 NOTE — Patient Instructions (Signed)
[name],  Very nice to meet you.  1. Pap and breast exam done today. You will be called with results.   2. Hives: Claritin 10 mg daily   3. Hot flashes: follow up with Dr. Orpah CobbAdvani to discuss treatment options  You will be called with pap results  F/u with Dr. Orpah CobbAdvani for hot flashes at your earliest convenience  Dr. Armen PickupFunches

## 2014-09-26 NOTE — Progress Notes (Signed)
Pap smear Patient c/o abdominal pain Patient c/o night sweats and has had no period since 11/2013

## 2014-09-27 ENCOUNTER — Telehealth: Payer: Self-pay | Admitting: *Deleted

## 2014-09-27 DIAGNOSIS — B9689 Other specified bacterial agents as the cause of diseases classified elsewhere: Secondary | ICD-10-CM | POA: Insufficient documentation

## 2014-09-27 DIAGNOSIS — N760 Acute vaginitis: Secondary | ICD-10-CM

## 2014-09-27 LAB — CERVICOVAGINAL ANCILLARY ONLY
CHLAMYDIA, DNA PROBE: NEGATIVE
Neisseria Gonorrhea: NEGATIVE
WET PREP (BD AFFIRM): NEGATIVE
Wet Prep (BD Affirm): NEGATIVE
Wet Prep (BD Affirm): POSITIVE — AB

## 2014-09-27 MED ORDER — METRONIDAZOLE 500 MG PO TABS: 500.0000 mg | ORAL_TABLET | Freq: Two times a day (BID) | ORAL | Status: DC

## 2014-09-27 MED ORDER — FLUCONAZOLE 150 MG PO TABS: 150.0000 mg | ORAL_TABLET | Freq: Once | ORAL | Status: DC

## 2014-09-27 NOTE — Telephone Encounter (Signed)
Left voice message to return call 

## 2014-09-27 NOTE — Assessment & Plan Note (Signed)
A:negative urine pregnancy. History consistent with perimenopausal state.   P: Reassurance PCP follow-up to discuss treatment for hot flashes.

## 2014-09-27 NOTE — Telephone Encounter (Signed)
Pt. Aware of lab results.

## 2014-09-27 NOTE — Assessment & Plan Note (Signed)
Noted on wet prep. Will treat with Flagyl and Diflucan.

## 2014-09-27 NOTE — Assessment & Plan Note (Signed)
Screening Pap smear done.

## 2014-09-27 NOTE — Assessment & Plan Note (Signed)
A: Noted on physical exam. Triggered by sweating. P: Prescribed antihistamine.

## 2014-09-27 NOTE — Telephone Encounter (Signed)
-----   Message from Lora PaulaJosalyn C Funches, MD sent at 09/27/2014  8:51 AM EST ----- Bacteria vaginosis noted on wet prep otherwise negative, negative GC chlamydia. Will treat with Flagyl and Diflucan.

## 2014-10-03 ENCOUNTER — Telehealth: Payer: Self-pay | Admitting: *Deleted

## 2014-10-03 NOTE — Telephone Encounter (Signed)
Pt aware of results 

## 2014-10-03 NOTE — Telephone Encounter (Signed)
-----   Message from Lora PaulaJosalyn C Funches, MD sent at 10/03/2014 10:44 AM EST ----- Negative pap, repeat in 5 years

## 2014-10-07 LAB — CYTOLOGY - PAP

## 2014-10-10 ENCOUNTER — Telehealth: Payer: Self-pay

## 2014-10-10 ENCOUNTER — Ambulatory Visit: Payer: Self-pay | Admitting: Nurse Practitioner

## 2014-10-10 NOTE — Telephone Encounter (Signed)
-----   Message from Doris Cheadleeepak Advani, MD sent at 09/27/2014  9:19 AM EST ----- Call and let the patient know that her abdominal ultrasound is reported to be normal.  FINDINGS: Gallbladder: No gallstones, gallbladder wall thickening, or pericholecystic fluid. Negative sonographic Murphy's sign.

## 2014-10-10 NOTE — Telephone Encounter (Signed)
Patient not available Left message on voice mail that her US was normal

## 2014-12-18 ENCOUNTER — Ambulatory Visit: Payer: Self-pay | Attending: Internal Medicine

## 2015-08-19 ENCOUNTER — Other Ambulatory Visit: Payer: Self-pay

## 2015-08-19 DIAGNOSIS — Z1231 Encounter for screening mammogram for malignant neoplasm of breast: Secondary | ICD-10-CM

## 2015-09-10 ENCOUNTER — Ambulatory Visit
Admission: RE | Admit: 2015-09-10 | Discharge: 2015-09-10 | Disposition: A | Discharge status: Home / Self Care | Payer: No Typology Code available for payment source | Source: Ambulatory Visit

## 2015-09-10 DIAGNOSIS — Z1231 Encounter for screening mammogram for malignant neoplasm of breast: Secondary | ICD-10-CM

## 2016-11-16 ENCOUNTER — Other Ambulatory Visit: Payer: Self-pay | Admitting: Obstetrics and Gynecology

## 2016-11-16 DIAGNOSIS — Z1231 Encounter for screening mammogram for malignant neoplasm of breast: Secondary | ICD-10-CM

## 2016-11-30 ENCOUNTER — Encounter (HOSPITAL_COMMUNITY): Payer: Self-pay | Admitting: *Deleted

## 2016-11-30 ENCOUNTER — Ambulatory Visit
Admission: RE | Admit: 2016-11-30 | Discharge: 2016-11-30 | Disposition: A | Discharge status: Home / Self Care | Payer: No Typology Code available for payment source | Source: Ambulatory Visit | Attending: Obstetrics and Gynecology | Admitting: Obstetrics and Gynecology

## 2016-11-30 ENCOUNTER — Ambulatory Visit (HOSPITAL_COMMUNITY)
Admission: RE | Admit: 2016-11-30 | Discharge: 2016-11-30 | Disposition: A | Discharge status: Home / Self Care | Payer: Self-pay | Source: Ambulatory Visit | Attending: Obstetrics and Gynecology | Admitting: Obstetrics and Gynecology

## 2016-11-30 VITALS — BP 120/82 | Temp 98.8°F | Ht 61.5 in | Wt 105.6 lb

## 2016-11-30 DIAGNOSIS — Z1239 Encounter for other screening for malignant neoplasm of breast: Secondary | ICD-10-CM

## 2016-11-30 DIAGNOSIS — Z1231 Encounter for screening mammogram for malignant neoplasm of breast: Secondary | ICD-10-CM

## 2016-11-30 NOTE — Progress Notes (Signed)
No complaints today.   Pap Smear: Pap smear not completed today. Last Pap smear was 09/26/2014 at [facility] and normal with negative HPV. Per patient has no history of an abnormal Pap smear.   Physical exam: Breasts Breasts symmetrical. No skin abnormalities bilateral breasts. No nipple retraction bilateral breasts. No nipple discharge bilateral breasts. No lymphadenopathy. No lumps palpated bilateral breasts. No complaints of pain or tenderness on exam. Referred patient to the [facility] for a screening mammogram. Appointment scheduled for Tuesday, November 30, 2016 at 1410.        Pelvic/Bimanual No Pap smear completed today since last Pap smear and HPV typing was 09/26/2014. Pap smear not indicated per BCCCP guidelines.   Smoking History: Patient has never smoked.  Patient Navigation: Patient education provided. Access to services provided for patient through [facility] program.

## 2016-11-30 NOTE — Patient Instructions (Signed)
Explained breast self awareness with Laura Estes. Patient did not need a Pap smear today due to last Pap smear and HPV typing was 09/26/2014. Let her know BCCCP will cover Pap smears and HPV typing every 5 years unless has a history of abnormal Pap smears. Referred patient to the [facility] for a screening mammogram. Appointment scheduled for Tuesday, November 30, 2016 at 1410. Let patient know the Breast Center will follow up with her within the next couple weeks with results of mammogram by letter or phone. Laura Estes verbalized understanding.  Laura Estes, Laura Maser, RN 3:06 PM

## 2016-12-03 ENCOUNTER — Encounter (HOSPITAL_COMMUNITY): Payer: Self-pay | Admitting: *Deleted

## 2018-01-25 ENCOUNTER — Other Ambulatory Visit: Payer: Self-pay

## 2018-01-25 DIAGNOSIS — Z1231 Encounter for screening mammogram for malignant neoplasm of breast: Secondary | ICD-10-CM

## 2018-03-08 ENCOUNTER — Other Ambulatory Visit: Payer: Self-pay | Admitting: Obstetrics and Gynecology

## 2018-03-08 DIAGNOSIS — Z1231 Encounter for screening mammogram for malignant neoplasm of breast: Secondary | ICD-10-CM

## 2018-03-28 ENCOUNTER — Encounter (HOSPITAL_COMMUNITY): Payer: Self-pay

## 2018-03-28 ENCOUNTER — Ambulatory Visit (HOSPITAL_COMMUNITY)
Admission: RE | Admit: 2018-03-28 | Discharge: 2018-03-28 | Disposition: A | Discharge status: Home / Self Care | Payer: No Typology Code available for payment source | Source: Ambulatory Visit | Attending: Obstetrics and Gynecology | Admitting: Obstetrics and Gynecology

## 2018-03-28 ENCOUNTER — Ambulatory Visit
Admission: RE | Admit: 2018-03-28 | Discharge: 2018-03-28 | Disposition: A | Discharge status: Home / Self Care | Payer: No Typology Code available for payment source | Source: Ambulatory Visit | Attending: Obstetrics and Gynecology | Admitting: Obstetrics and Gynecology

## 2018-03-28 VITALS — BP 128/74 | Ht 61.0 in

## 2018-03-28 DIAGNOSIS — Z1231 Encounter for screening mammogram for malignant neoplasm of breast: Secondary | ICD-10-CM

## 2018-03-28 DIAGNOSIS — Z1239 Encounter for other screening for malignant neoplasm of breast: Secondary | ICD-10-CM

## 2018-03-28 NOTE — Progress Notes (Signed)
No complaints today.   Pap Smear: Pap smear not completed today. [facility] and normal with negative HPV. Per patient has no history of an abnormal Pap smear.   Physical exam: Breasts Breasts symmetrical. No skin abnormalities bilateral breasts. No nipple retraction bilateral breasts. No nipple discharge bilateral breasts. No lymphadenopathy. No lumps palpated bilateral breasts. No complaints of pain or tenderness on exam. [facility] for [facility] screening mamm[facility]. Appointment scheduled for Tuesday, March 28, 2018 at 1240.        Pelvic/Bimanual No Pap smear completed today since last Pap smear and HPV typing was 09/26/2014. Pap smear not indicated per BCCCP guidelines.   Smoking History: Patient has never smoked.  Patient Navigation: Patient education provided. Access to services provided for patient through BCCCP program.   Breast and Cervical Cancer Risk Assessment: Patient has a family history of a maternal aunt and a maternal cousin with breast cancer. Patient has no known genetic mutations or history of radiation treatment to the chest before age 48. Patient has no history of cervical dysplasia, immunocompromised, or DES exposure in-utero.  Risk Assessment    Risk Scores      03/28/2018   Last edited by: Priscille HeidelbergBrannock, Mailani Degroote P, RN   5-year risk: 1 %   Lifetime risk: 9 %

## 2018-03-28 NOTE — Patient Instructions (Addendum)
Explained breast self awareness with Laura Estes. Patient did not need a Pap smear today due to last Pap smear and HPV typing was 09/26/2014. Let her know BCCCP will cover Pap smears and HPV typing every 5 years unless has a history of abnormal Pap smears. [facility] for [facility] screening mamm[facility]. Appointment scheduled for Tuesday, March 28, 2018 at 1240. Let patient know the Breast Center will follow up with her within the next couple weeks with results of mammogram by letter or phone. Laura Estes verbalized understanding.  Laura Estes, [name], RN 12:18 PM

## 2018-07-27 ENCOUNTER — Encounter (HOSPITAL_COMMUNITY): Payer: Self-pay

## 2018-07-27 ENCOUNTER — Other Ambulatory Visit: Payer: Self-pay

## 2018-07-27 ENCOUNTER — Ambulatory Visit (HOSPITAL_COMMUNITY)
Admission: EM | Admit: 2018-07-27 | Discharge: 2018-07-27 | Disposition: A | Discharge status: Home / Self Care | Payer: No Typology Code available for payment source | Attending: Family Medicine | Admitting: Family Medicine

## 2018-07-27 DIAGNOSIS — Z79899 Other long term (current) drug therapy: Secondary | ICD-10-CM | POA: Insufficient documentation

## 2018-07-27 DIAGNOSIS — R319 Hematuria, unspecified: Secondary | ICD-10-CM

## 2018-07-27 DIAGNOSIS — N390 Urinary tract infection, site not specified: Secondary | ICD-10-CM | POA: Insufficient documentation

## 2018-07-27 DIAGNOSIS — R0789 Other chest pain: Secondary | ICD-10-CM

## 2018-07-27 DIAGNOSIS — R0602 Shortness of breath: Secondary | ICD-10-CM

## 2018-07-27 LAB — POCT URINALYSIS DIP (DEVICE)
Glucose, UA: NEGATIVE mg/dL
Leukocytes, UA: NEGATIVE
Nitrite: POSITIVE — AB
PH: 5.5 (ref 5.0–8.0)
Protein, ur: 100 mg/dL — AB
Specific Gravity, Urine: >=1.03 (ref 1.005–1.030)
Urobilinogen, UA: 0.2 mg/dL (ref 0.0–1.0)

## 2018-07-27 LAB — POCT I-STAT, CHEM 8
BUN: 10 mg/dL (ref 6–20)
Calcium, Ion: 1.16 mmol/L (ref 1.15–1.40)
Chloride: 109 mmol/L (ref 98–111)
Creatinine, Ser: 1 mg/dL (ref 0.44–1.00)
GLUCOSE: 100 mg/dL — AB (ref 70–99)
HEMATOCRIT: 45 % (ref 36.0–46.0)
HEMOGLOBIN: 15.3 g/dL — AB (ref 12.0–15.0)
Potassium: 3.7 mmol/L (ref 3.5–5.1)
SODIUM: 142 mmol/L (ref 135–145)
TCO2: 25 mmol/L (ref 22–32)

## 2018-07-27 MED ORDER — CEPHALEXIN 500 MG PO CAPS: 500.0000 mg | ORAL_CAPSULE | Freq: Two times a day (BID) | ORAL | 0 refills | Status: DC

## 2018-07-27 NOTE — Discharge Instructions (Signed)
Your urine was positive for urinary tract infection We will treat this with Keflex twice a day for the next 7 days Make sure you are staying hydrated with plenty of fluids, water, Gatorade. Your blood work was normal I have not received your vitamin D level yet we will call you if this is low and needs to be treated I am giving you a contact for a primary care provider to follow-up with for continued or worsening symptoms

## 2018-07-27 NOTE — ED Provider Notes (Signed)
[facility]    CSN: [id number] Arrival date & time: 07/27/18  2595     History   Chief Complaint Chief Complaint  Patient presents with  . Leg Pain    HPI Laura Estes is a 47 y.o. female.   Patient is a 48 year old female presents with bilateral lower extremity muscle cramping, back pain, lower abdominal pain, low-grade fever, headaches.  This is been waxing and waning over the past 4 days. She has not been taking anything for her symptoms. Pt anxious and tearful. She is also complaining of tingling in the fingers, chest pain and SOB. No recent traveling, she does not smoke.  She denies any associated dysuria, hematuria, urinary frequency, vaginal discharge.  She denies any nausea, vomiting, diarrhea.  She is not currently sexually active.  She is menopausal.   ROS per HPI      Past Medical History:  Diagnosis Date  . Hypertension     Patient Active Problem List   Diagnosis Date Noted  . Bacterial vaginosis 09/27/2014  . Perimenopausal 09/26/2014  . Pap smear for cervical cancer screening 09/26/2014  . Urticaria 09/26/2014  . Dyspepsia 06/24/2014  . Elevated BP 06/24/2014    History reviewed. No pertinent surgical history.  OB History    Gravida  1   Para      Term      Preterm      AB  1   Living  0     SAB      TAB  1   Ectopic      Multiple      Live Births  0            Home Medications    Prior to Admission medications   Medication Sig Start Date End Date Taking? Authorizing Provider  aluminum-magnesium hydroxide-simethicone (MAALOX) 200-200-20 MG/5ML SUSP Take 30 mLs by mouth 4 (four) times daily -  before meals and at bedtime. Patient not taking: Reported on 11/30/2016 06/24/14   Doris Cheadle, MD  cephALEXin (KEFLEX) 500 MG capsule Take 1 capsule (500 mg total) by mouth 2 (two) times daily. 07/27/18   Dahlia Byes A, NP  fluconazole (DIFLUCAN) 150 MG tablet Take 1 tablet (150 mg total) by mouth once. Patient not  taking: Reported on 11/30/2016 09/27/14   Dessa Phi, MD  loratadine (CLARITIN) 10 MG tablet Take 1 tablet (10 mg total) by mouth daily. 09/26/14   Funches, Gerilyn Nestle, MD  metroNIDAZOLE (FLAGYL) 500 MG tablet Take 1 tablet (500 mg total) by mouth 2 (two) times daily. Patient not taking: Reported on 11/30/2016 09/27/14   Dessa Phi, MD  omeprazole (PRILOSEC) 40 MG capsule Take 1 capsule (40 mg total) by mouth daily. Patient not taking: Reported on 11/30/2016 09/19/14   Doris Cheadle, MD    Family History Family History  Problem Relation Age of Onset  . Cancer Mother        lung  . Hypertension Father   . Heart disease Father   . Cancer Maternal Grandmother        ovarian and uterine  . Breast cancer Maternal Aunt     Social History Social History   Tobacco Use  . Smoking status: Never Smoker  . Smokeless tobacco: Never Used  Substance Use Topics  . Alcohol use: No    Alcohol/week: 0.0 standard drinks  . Drug use: No     Allergies   Tetracyclines & related   Review of Systems Review of Systems  Physical Exam Triage Vital Signs ED Triage Vitals  Enc Vitals Group     BP 07/27/18 0953 (!) 146/82     Pulse Rate 07/27/18 0953 (!) 113     Resp 07/27/18 0953 16     Temp 07/27/18 0953 99.4 F (37.4 C)     Temp Source 07/27/18 0953 Oral     SpO2 07/27/18 0953 100 %     Weight 07/27/18 0952 120 lb (54.4 kg)     Height --      Head Circumference --      Peak Flow --      Pain Score --      Pain Loc --      Pain Edu? --      Excl. in GC? --    No data found.  Updated Vital Signs BP 126/84 (BP Location: Left Arm)   Pulse 97   Temp 99.4 F (37.4 C) (Oral)   Resp 16   Wt 120 lb (54.4 kg)   LMP 11/15/2013   SpO2 100%   BMI 22.67 kg/m   Visual Acuity Right Eye Distance:   Left Eye Distance:   Bilateral Distance:    Right Eye Near:   Left Eye Near:    Bilateral Near:     Physical Exam Vitals signs and nursing note reviewed.  Constitutional:       Appearance: Normal appearance.  HENT:     Head: Normocephalic and atraumatic.     Nose: Nose normal.  Eyes:     Conjunctiva/sclera: Conjunctivae normal.  Cardiovascular:     Rate and Rhythm: Normal rate and regular rhythm.     Pulses: Normal pulses.     Heart sounds: Normal heart sounds.  Pulmonary:     Effort: Pulmonary effort is normal. No respiratory distress.     Breath sounds: Normal breath sounds.  Abdominal:     General: Bowel sounds are normal.     Palpations: Abdomen is soft.  Musculoskeletal: Normal range of motion.  Skin:    General: Skin is warm and dry.  Neurological:     General: No focal deficit present.     Mental Status: She is alert.  Psychiatric:        Mood and Affect: Mood is anxious. Affect is tearful.      UC Treatments / Results  Labs (all labs ordered are listed, but only abnormal results are displayed) Labs Reviewed  POCT URINALYSIS DIP (DEVICE) - Abnormal; Notable for the following components:      Result Value   Bilirubin Urine SMALL (*)    Ketones, ur TRACE (*)    Hgb urine dipstick TRACE (*)    Protein, ur 100 (*)    Nitrite POSITIVE (*)    All other components within normal limits  POCT I-STAT, CHEM 8 - Abnormal; Notable for the following components:   Glucose, Bld 100 (*)    Hemoglobin 15.3 (*)    All other components within normal limits  URINE CULTURE  I-STAT CHEM 8, ED    EKG None  Radiology No results found.  Procedures Procedures (including critical care time)  Medications Ordered in UC Medications - No data to display  Initial Impression / Assessment and Plan / UC Course  I have reviewed the triage vital signs and the nursing notes.  Pertinent labs & imaging results that were available during my care of the patient were reviewed by me and considered in my medical decision making (see chart for  details).    UTI and dehydration.  Urine positive for infection. Her body aches, chills, low-grade fever are likely  related to this. I believe that the muscle cramping in her lower extremities is related to dehydration EKG was normal sinus rhythm with normal rate Vital signs reassessed with normal heart rate. Patient reassured and less anxious Prescription sent to the pharmacy to treat the urinary tract infection. Instructed her to rehydrate with plenty of water, Gatorade Lab work here was normal with vitamin D still pending We will call with any abnormal results Gave patient contact for primary care provider If her symptoms continue she will need to follow-up with primary care For more severe worsening symptoms you need to go to the hospital Patient understanding and agreeable to plan Final Clinical Impressions(s) / UC Diagnoses   Final diagnoses:  Urinary tract infection with hematuria, site unspecified     Discharge Instructions     Your urine was positive for urinary tract infection We will treat this with Keflex twice a day for the next 7 days Make sure you are staying hydrated with plenty of fluids, water, Gatorade. Your blood work was normal I have not received your vitamin D level yet we will call you if this is low and needs to be treated I am giving you a contact for a primary care provider to follow-up with for continued or worsening symptoms    ED Prescriptions    Medication Sig Dispense Auth. Provider   cephALEXin (KEFLEX) 500 MG capsule Take 1 capsule (500 mg total) by mouth 2 (two) times daily. 28 capsule Dahlia Byes A, NP     Controlled Substance Prescriptions [place] Controlled Substance Registry consulted? Not Applicable   Janace Aris, NP 07/27/18 1115

## 2018-07-27 NOTE — ED Triage Notes (Signed)
Pt cc her legs and arms have been getting  stiff off and on . X 4 days.

## 2018-07-28 LAB — URINE CULTURE: Culture: NO GROWTH

## 2018-08-24 ENCOUNTER — Encounter: Payer: Self-pay | Admitting: Family Medicine

## 2018-08-28 ENCOUNTER — Ambulatory Visit: Payer: No Typology Code available for payment source | Admitting: Family Medicine

## 2018-08-29 ENCOUNTER — Ambulatory Visit (INDEPENDENT_AMBULATORY_CARE_PROVIDER_SITE_OTHER): Payer: Self-pay | Admitting: Family Medicine

## 2018-08-29 ENCOUNTER — Encounter: Payer: Self-pay | Admitting: Family Medicine

## 2018-08-29 VITALS — BP 143/75 | HR 75 | Temp 98.6°F | Resp 17 | Ht 61.0 in | Wt 105.8 lb

## 2018-08-29 DIAGNOSIS — Z114 Encounter for screening for human immunodeficiency virus [HIV]: Secondary | ICD-10-CM

## 2018-08-29 DIAGNOSIS — M6289 Other specified disorders of muscle: Secondary | ICD-10-CM

## 2018-08-29 DIAGNOSIS — M7918 Myalgia, other site: Secondary | ICD-10-CM

## 2018-08-29 DIAGNOSIS — Z130 Encounter for screening for diseases of the blood and blood-forming organs and certain disorders involving the immune mechanism: Secondary | ICD-10-CM

## 2018-08-29 DIAGNOSIS — R030 Elevated blood-pressure reading, without diagnosis of hypertension: Secondary | ICD-10-CM

## 2018-08-29 DIAGNOSIS — Z1389 Encounter for screening for other disorder: Secondary | ICD-10-CM

## 2018-08-29 DIAGNOSIS — Z131 Encounter for screening for diabetes mellitus: Secondary | ICD-10-CM

## 2018-08-29 LAB — POCT URINALYSIS DIP (CLINITEK)
Bilirubin, UA: NEGATIVE
Blood, UA: NEGATIVE
Glucose, UA: NEGATIVE mg/dL
Ketones, POC UA: NEGATIVE mg/dL
LEUKOCYTES UA: NEGATIVE
NITRITE UA: NEGATIVE
POC PROTEIN,UA: NEGATIVE
SPEC GRAV UA: 1.01 (ref 1.010–1.025)
UROBILINOGEN UA: 0.2 U/dL
pH, UA: 6 (ref 5.0–8.0)

## 2018-08-29 MED ORDER — MELOXICAM 15 MG PO TABS: 15.0000 mg | ORAL_TABLET | Freq: Every day | ORAL | 0 refills | Status: DC

## 2018-08-29 NOTE — Patient Instructions (Signed)
[facility]    Laura Estes was seen by Joaquin CourtsKimberly Jaivyn Gulla, FNP today.   Laura Estes's primary care doctor is Bing NeighborsHarris, Tayana Shankle S, FNP.   For the best care possible,  you should try to see Joaquin CourtsKimberly Renso Swett, FNP-C  whenever you come to clinic.   We look forward to seeing you again soon!  If you have any questions about your visit today,  please call us at [phone]  Or feel free to reach your provider via MyChart.    Trial Meloxicam 15 mg once daily as needed for joint pain and muscle stiffness.  Return in 2 weeks for follow-up of joint stiffness and blood pressure check.   Spasticity Spasticity is a condition in which your muscles contract suddenly and unpredictably (spasm). Spasticity usually affects your arms, legs, or back. It can also affect the way you walk. Spasticity can range from mild muscle stiffness and tightness to severe, uncontrollable muscle spasms. Severe spasticity can be painful and can freeze your muscles in an uncomfortable position. Follow these instructions at home: Managing muscle stiffness and spasms      Wear a brace as told by your health care provider to prevent muscle contractions.  Have the affected muscles massaged.  If directed, apply heat to the affected muscle area. Use the heat source that your health care provider recommends, such as a moist heat pack or heating pad. ? Place a towel between your skin and the heat source. ? Leave the heat on for 20-30 minutes. ? Remove the head if your skin turns bright red. This is especially important if you are unable to feel pain, heat, or cold. You may have a greater risk of getting burned.  If directed, apply ice to the affected muscle area: ? Put ice in a plastic bag. ? Place a towel between your skin and the bag or between your brace and the bag. ? Leave the ice on for 20 minutes, 2?3 times a day. Activity  Stay active as directed  by your health care provider. Find a safe exercise program that fits your needs and ability.  Maintain good posture when walking and sitting.  Work with a physical therapist to learn exercises that will stretch and strengthen your muscles.  Do stretching and range of motion exercises at home as told by a physical therapist.  Work with an occupational therapist. This type of health care provider can help you function better at home and at work.  If you have severe spasticity, use mobility aids, such as a walker or cane, as told by your health care provider. General instructions  Watch your condition for any changes.  Wear loose, comfortable clothing that does not restrict your movement.  Wear closed-toe shoes that fit well and support your feet. Wear shoes that have rubber soles or low heels.  Keep all follow-up visits as told by your health care provider. This is important.  Take over-the-counter and prescription medicine only as told by your health care provider. Contact a health care provider if you:  Have worsening muscle spasms.  Develop other symptoms along with spasticity.  Have a fever or chills.  Experience a burning feeling when you pass urine.  Become constipated.  Need more support at home. Get help right away if you:  Have trouble breathing.  Have a muscle spasm that freezes you into a painful position.  Cannot walk.  Cannot care for yourself at home.  Have trouble passing urine or have urinary incontinence. Summary  Spasticity is a condition in which your muscles contract suddenly and unpredictably (spasm). Spasticity usually affects your arms, legs, or back.  Spasticity can range from mild muscle stiffness and tightness to severe, uncontrollable muscle spasms.  Do stretching and range of motion exercises at home as told by a physical therapist.  Take over-the-counter and prescription medicine only as told by your health care provider. This  information is not intended to replace advice given to you by your health care provider. Make sure you discuss any questions you have with your health care provider. Document Released: 07/23/2002 Document Revised: 08/30/2017 Document Reviewed: 08/30/2017 Elsevier Interactive Patient Education  2019 [employer] .         High Blood pressure Hypertension, commonly called high blood pressure, is when the force of blood pumping through the arteries is too strong. The arteries are the blood vessels that carry blood from the heart throughout the body. Hypertension forces the heart to work harder to pump blood and may cause arteries to become narrow or stiff. Having untreated or uncontrolled hypertension can cause heart attacks, strokes, kidney disease, and other problems. A blood pressure reading consists of a higher number over a lower number. Ideally, your blood pressure should be below 120/80. The first ("top") number is called the systolic pressure. It is a measure of the pressure in your arteries as your heart beats. The second ("bottom") number is called the diastolic pressure. It is a measure of the pressure in your arteries as the heart relaxes. What are the causes? The cause of this condition is not known. What increases the risk? Some risk factors for high blood pressure are under your control. Others are not. Factors you can change  Smoking.  Having type 2 diabetes mellitus, high cholesterol, or both.  Not getting enough exercise or physical activity.  Being overweight.  Having too much fat, sugar, calories, or salt (sodium) in your diet.  Drinking too much alcohol. Factors that are difficult or impossible to change  Having chronic kidney disease.  Having a family history of high blood pressure.  Age. Risk increases with age.  Race. You may be at higher risk if you are African-American.  Gender. Men are at higher risk than women before age 66. After age 14, women are  at higher risk than men.  Having obstructive sleep apnea.  Stress. What are the signs or symptoms? Extremely high blood pressure (hypertensive crisis) may cause:  Headache.  Anxiety.  Shortness of breath.  Nosebleed.  Nausea and vomiting.  Severe chest pain.  Jerky movements you cannot control (seizures). How is this diagnosed? This condition is diagnosed by measuring your blood pressure while you are seated, with your arm resting on a surface. The cuff of the blood pressure monitor will be placed directly against the skin of your upper arm at the level of your heart. It should be measured at least twice using the same arm. Certain conditions can cause a difference in blood pressure between your right and left arms. Certain factors can cause blood pressure readings to be lower or higher than normal (elevated) for a short period of time:  When your blood pressure is higher when you are in a health care provider's office than when you are at home, this is called white coat hypertension. Most people with this condition do not need medicines.  When your blood pressure is higher at home than when you are in  a health care provider's office, this is called masked hypertension. Most people with this condition may need medicines to control blood pressure. If you have a high blood pressure reading during one visit or you have normal blood pressure with other risk factors:  You may be asked to return on a different day to have your blood pressure checked again.  You may be asked to monitor your blood pressure at home for 1 week or longer. If you are diagnosed with hypertension, you may have other blood or imaging tests to help your health care provider understand your overall risk for other conditions. How is this treated? This condition is treated by making healthy lifestyle changes, such as eating healthy foods, exercising more, and reducing your alcohol intake. Your health care provider  may prescribe medicine if lifestyle changes are not enough to get your blood pressure under control, and if:  Your systolic blood pressure is above 130.  Your diastolic blood pressure is above 80. Your personal target blood pressure may vary depending on your medical conditions, your age, and other factors. Follow these instructions at home: Eating and drinking   Eat a diet that is high in fiber and potassium, and low in sodium, added sugar, and fat. An example eating plan is called the DASH (Dietary Approaches to Stop Hypertension) diet. To eat this way: ? Eat plenty of fresh fruits and vegetables. Try to fill half of your plate at each meal with fruits and vegetables. ? Eat whole grains, such as whole wheat pasta, brown rice, or whole grain bread. Fill about one quarter of your plate with whole grains. ? Eat or drink low-fat dairy products, such as skim milk or low-fat yogurt. ? Avoid fatty cuts of meat, processed or cured meats, and poultry with skin. Fill about one quarter of your plate with lean proteins, such as fish, chicken without skin, beans, eggs, and tofu. ? Avoid premade and processed foods. These tend to be higher in sodium, added sugar, and fat.  Reduce your daily sodium intake. Most people with hypertension should eat less than 1,500 mg of sodium a day.  Limit alcohol intake to no more than 1 drink a day for nonpregnant women and 2 drinks a day for men. One drink equals 12 oz of beer, 5 oz of wine, or 1 oz of hard liquor. Lifestyle   Work with your health care provider to maintain a healthy body weight or to lose weight. Ask what an ideal weight is for you.  Get at least 30 minutes of exercise that causes your heart to beat faster (aerobic exercise) most days of the week. Activities may include walking, swimming, or biking.  Include exercise to strengthen your muscles (resistance exercise), such as pilates or lifting weights, as part of your weekly exercise routine. Try to  do these types of exercises for 30 minutes at least 3 days a week.  Do not use any products that contain nicotine or tobacco, such as cigarettes and e-cigarettes. If you need help quitting, ask your health care provider.  Monitor your blood pressure at home as told by your health care provider.  Keep all follow-up visits as told by your health care provider. This is important. Medicines  Take over-the-counter and prescription medicines only as told by your health care provider. Follow directions carefully. Blood pressure medicines must be taken as prescribed.  Do not skip doses of blood pressure medicine. Doing this puts you at risk for problems and can make the  medicine less effective.  Ask your health care provider about side effects or reactions to medicines that you should watch for. Contact a health care provider if:  You think you are having a reaction to a medicine you are taking.  You have headaches that keep coming back (recurring).  You feel dizzy.  You have swelling in your ankles.  You have trouble with your vision. Get help right away if:  You develop a severe headache or confusion.  You have unusual weakness or numbness.  You feel faint.  You have severe pain in your chest or abdomen.  You vomit repeatedly.  You have trouble breathing. Summary  Hypertension is when the force of blood pumping through your arteries is too strong. If this condition is not controlled, it may put you at risk for serious complications.  Your personal target blood pressure may vary depending on your medical conditions, your age, and other factors. For most people, a normal blood pressure is less than 120/80.  Hypertension is treated with lifestyle changes, medicines, or a combination of both. Lifestyle changes include weight loss, eating a healthy, low-sodium diet, exercising more, and limiting alcohol. This information is not intended to replace advice given to you by your health  care provider. Make sure you discuss any questions you have with your health care provider. Document Released: 08/02/2005 Document Revised: 06/30/2016 Document Reviewed: 06/30/2016 Elsevier Interactive Patient Education  2019 [employer].

## 2018-08-29 NOTE — Progress Notes (Signed)
Laura Estes, is a 48 y.o. female  JYN:[id number]  QMV:[id number]  DOB - 02/06/1971  CC:  Chief Complaint  Patient presents with  . Establish Care  . Muscle Stiffness    mostly in B thighs & calves x 1+ month. some joint pain as well       HPI: Randel BooksKiki is a 48 y.o. female is here today to establish care and muscle stiffness.  Laura Estes has Dyspepsia; Elevated BP; Perimenopausal; Pap smear for cervical cancer screening; Urticaria; and Bacterial vaginosis on their problem list.  Patient reports no recent primary care. She is concerned as for the past month she has developed generalized body aches although most worrisome is stiffening and craming of bilateral legs. This is a new problem. Denies leg She has not attempted any medication for symptoms. She endorses poor water intake. No unintentional weight loss, no changes in appetite, no reddened or swollen joints. Patient denies new headaches, chest pain, abdominal pain, nausea,numbness or tingling, SOB, edema, or worrisome cough.   Blood pressure elevated. No prior treatment of hypertension although has a history of elevated readings in the past. Denies headaches or lower extremity swelling. Family history significant for hypertension and heart disease.  Current medications:none  Pertinent family medical history: family history includes Breast cancer in her maternal aunt; Cancer in her maternal grandmother; Heart disease in her father; Hypertension in her father; Lung cancer in her mother.   Allergies  Allergen Reactions  . Tetracyclines & Related     Social History   Socioeconomic History  . Marital status: Single    Spouse name: Not on file  . Number of children: Not on file  . Years of education: Not on file  . Highest education level: Not on file  Occupational History  . Not on file  Social Needs  . Financial resource strain: Not on file  . Food insecurity:    Worry: Not on file    Inability: Not on file  .  Transportation needs:    Medical: Not on file    Non-medical: Not on file  Tobacco Use  . Smoking status: Never Smoker  . Smokeless tobacco: Never Used  Substance and Sexual Activity  . Alcohol use: No    Alcohol/week: 0.0 standard drinks  . Drug use: No  . Sexual activity: Not Currently    Birth control/protection: None  Lifestyle  . Physical activity:    Days per week: Not on file    Minutes per session: Not on file  . Stress: Not on file  Relationships  . Social connections:    Talks on phone: Not on file    Gets together: Not on file    Attends religious service: Not on file    Active member of club or organization: Not on file    Attends meetings of clubs or organizations: Not on file    Relationship status: Not on file  . Intimate partner violence:    Fear of current or ex partner: Not on file    Emotionally abused: Not on file    Physically abused: Not on file    Forced sexual activity: Not on file  Other Topics Concern  . Not on file  Social History Narrative  . Not on file    Review of Systems: Pertinent negatives listed in HPI  Objective:   Vitals:   08/29/18 1419  BP: (!) 143/75  Pulse: 75  Resp: 17  Temp: 98.6 F (37 C)  SpO2: 97%  BP Readings from Last 3 Encounters:  08/29/18 (!) 143/75  07/27/18 126/84  03/28/18 128/74    Filed Weights   08/29/18 1419  Weight: 105 lb 12.8 oz (48 kg)      Physical Exam: Constitutional: Patient appears well-developed and well-nourished. No distress. HENT: Normocephalic, atraumatic, External right and left ear normal. Oropharynx is clear and moist.  Eyes: Conjunctivae and EOM are normal. PERRLA, no scleral icterus. Neck: Normal ROM. Neck supple. No JVD. No tracheal deviation. No thyromegaly. CVS: RRR, S1/S2 +, no murmurs, no gallops, no carotid bruit.  Pulmonary: Effort and breath sounds normal, no stridor, rhonchi, wheezes, rales.  Abdominal: Soft. BS +, no distension, tenderness, rebound or guarding.   Musculoskeletal: Normal range of motion. No edema and no tenderness.  Neuro: Alert. Normal muscle tone coordination. Normal gait. BUE and BLE strength 5/5. Bilateral hand grips symmetrical. Skin: Skin is warm and dry. No rash noted. Not diaphoretic. No erythema. No pallor. Psychiatric: Normal mood and affect. Behavior, judgment, thought content normal.  Lab Results (prior encounters)  Lab Results  Component Value Date   WBC 4.2 06/24/2014   HGB 15.3 (H) 07/27/2018   HCT 45.0 07/27/2018   MCV 81.1 06/24/2014   PLT 312 06/24/2014   Lab Results  Component Value Date   CREATININE 1.00 07/27/2018   BUN 10 07/27/2018   NA 142 07/27/2018   K 3.7 07/27/2018   CL 109 07/27/2018   CO2 25 06/24/2014    Lab Results  Component Value Date   HGBA1C 5.8 (H) 06/24/2014       Component Value Date/Time   CHOL 272 (H) 06/24/2014 1115   TRIG 49 06/24/2014 1115   HDL 124 06/24/2014 1115   CHOLHDL 2.2 06/24/2014 1115   VLDL 10 06/24/2014 1115   LDLCALC 138 (H) 06/24/2014 1115       Assessment and plan:  1. Screening for blood or protein in urine - POCT URINALYSIS DIP (CLINITEK)  2. Muscle stiffness Increase water intake and engage in routine physical activity. Will check the following labs: - Vitamin D, 25-hydroxy - Comprehensive metabolic panel - Sedimentation Rate  3. Screening for diabetes mellitus - Hemoglobin A1c  4. Screening for HIV (human immunodeficiency virus) - HIV antibody (with reflex)  5. Screening for deficiency anemia - CBC  6. Elevated BP without diagnosis of hypertension If BP remains elevated will start amlodipine 2.5 mg once daily.  Checking a TSH level.   Return in about 2 weeks (around 09/12/2018) for muscle stiffness and bp check.   The patient was given clear instructions to go to ER or return to medical center if symptoms don't improve, worsen or new problems develop. The patient verbalized understanding. The patient was advised  to call and  obtain lab results if they haven't heard anything from out office within 7-10 business days.  Joaquin Courts, FNP Primary Care at [facility] [address] [place] [address] [phone]: [phone]    This note has been created with Dragon speech recognition software and Paediatric nurse. Any transcriptional errors are unintentional.

## 2018-08-30 LAB — COMPREHENSIVE METABOLIC PANEL WITH GFR
ALT: 14 IU/L (ref 0–32)
AST: 18 IU/L (ref 0–40)
Albumin/Globulin Ratio: 1.7 (ref 1.2–2.2)
Albumin: 4.8 g/dL (ref 3.5–5.5)
Alkaline Phosphatase: 83 IU/L (ref 39–117)
BUN/Creatinine Ratio: 9 (ref 9–23)
BUN: 8 mg/dL (ref 6–24)
Bilirubin Total: 1 mg/dL (ref 0.0–1.2)
CO2: 24 mmol/L (ref 20–29)
Calcium: 10.1 mg/dL (ref 8.7–10.2)
Chloride: 100 mmol/L (ref 96–106)
Creatinine, Ser: 0.94 mg/dL (ref 0.57–1.00)
GFR calc Af Amer: 83 mL/min/1.73
GFR calc non Af Amer: 72 mL/min/1.73
Globulin, Total: 2.8 g/dL (ref 1.5–4.5)
Glucose: 78 mg/dL (ref 65–99)
Potassium: 4.3 mmol/L (ref 3.5–5.2)
Sodium: 141 mmol/L (ref 134–144)
Total Protein: 7.6 g/dL (ref 6.0–8.5)

## 2018-08-30 LAB — CBC
Hematocrit: 40.5 % (ref 34.0–46.6)
Hemoglobin: 13.1 g/dL (ref 11.1–15.9)
MCH: 26.6 pg (ref 26.6–33.0)
MCHC: 32.3 g/dL (ref 31.5–35.7)
MCV: 82 fL (ref 79–97)
Platelets: 350 10*3/uL (ref 150–450)
RBC: 4.92 x10E6/uL (ref 3.77–5.28)
RDW: 12.7 % (ref 11.7–15.4)
WBC: 4.7 10*3/uL (ref 3.4–10.8)

## 2018-08-30 LAB — THYROID PANEL WITH TSH
Free Thyroxine Index: 1.9 (ref 1.2–4.9)
T3 Uptake Ratio: 23 % — ABNORMAL LOW (ref 24–39)
T4, Total: 8.1 ug/dL (ref 4.5–12.0)
TSH: 0.634 u[IU]/mL (ref 0.450–4.500)

## 2018-08-30 LAB — SEDIMENTATION RATE: Sed Rate: 20 mm/h (ref 0–32)

## 2018-08-30 LAB — HEMOGLOBIN A1C
Est. average glucose Bld gHb Est-mCnc: 111 mg/dL
Hgb A1c MFr Bld: 5.5 % (ref 4.8–5.6)

## 2018-08-30 LAB — VITAMIN D 25 HYDROXY (VIT D DEFICIENCY, FRACTURES): Vit D, 25-Hydroxy: 8 ng/mL — ABNORMAL LOW (ref 30.0–100.0)

## 2018-08-30 LAB — HIV ANTIBODY (ROUTINE TESTING W REFLEX): HIV Screen 4th Generation wRfx: NONREACTIVE

## 2018-09-01 MED ORDER — VITAMIN D (ERGOCALCIFEROL) 1.25 MG (50000 UNIT) PO CAPS: 50000.0000 [IU] | ORAL_CAPSULE | ORAL | 0 refills | Status: DC

## 2018-09-01 NOTE — Progress Notes (Signed)
Patient notified of results & recommendations. Expressed understanding. Didn't know her work schedule so she will make a lab appointment for the 1st week in April when she comes for her appointment in 3 weeks.

## 2018-09-19 ENCOUNTER — Encounter: Payer: Self-pay | Admitting: Family Medicine

## 2018-09-19 ENCOUNTER — Ambulatory Visit (INDEPENDENT_AMBULATORY_CARE_PROVIDER_SITE_OTHER): Payer: Self-pay | Admitting: Family Medicine

## 2018-09-19 VITALS — BP 134/86 | HR 71 | Resp 17 | Ht 61.0 in | Wt 106.2 lb

## 2018-09-19 DIAGNOSIS — M6289 Other specified disorders of muscle: Secondary | ICD-10-CM

## 2018-09-19 DIAGNOSIS — R030 Elevated blood-pressure reading, without diagnosis of hypertension: Secondary | ICD-10-CM

## 2018-09-19 DIAGNOSIS — E559 Vitamin D deficiency, unspecified: Secondary | ICD-10-CM

## 2018-09-19 MED ORDER — MELOXICAM 15 MG PO TABS: 15.0000 mg | ORAL_TABLET | Freq: Every day | ORAL | 2 refills | Status: DC

## 2018-09-19 NOTE — Progress Notes (Signed)
Established Patient Office Visit  Subjective:  Patient ID: Laura Estes, female    DOB: 03-04-1971  Age: 48 y.o. MRN: [id number]  CC:  Chief Complaint  Patient presents with  . Muscle Pain    stiffness is about the same    HPI Laura Estes presents for muscle aches and blood pressure follow-up.  Seen in office 08/29/2018 with a complaint of gradually worsening muscle pain and was found to have severe vitamin D deficiency. She is currently taking Vitamin D replacement weekly. She continues to have muscle pain although has achieved much improvement with a daily dose of Meloxicam. She was worked up for autoimmune studies which all were unremarkable.   Blood pressure elevated during prior visit. No history of hypertension. BP today 134/86. No monitoring of readings at home.  Eyes chest pain, shortness of breath, weakness, headaches, dizziness. She did complain of just in visual acuity during last office visit and has been to follow-up with an eye exam. Past Medical History:  Diagnosis Date  . Hypertension     No past surgical history on file.  Family History  Problem Relation Age of Onset  . Lung cancer Mother   . Hypertension Father   . Heart disease Father   . Cancer Maternal Grandmother        ovarian and uterine  . Breast cancer Maternal Aunt     Social History   Socioeconomic History  . Marital status: Single    Spouse name: Not on file  . Number of children: Not on file  . Years of education: Not on file  . Highest education level: Not on file  Occupational History  . Not on file  Social Needs  . Financial resource strain: Not on file  . Food insecurity:    Worry: Not on file    Inability: Not on file  . Transportation needs:    Medical: Not on file    Non-medical: Not on file  Tobacco Use  . Smoking status: Never Smoker  . Smokeless tobacco: Never Used  Substance and Sexual Activity  . Alcohol use: No    Alcohol/week: 0.0 standard drinks  . Drug use:  No  . Sexual activity: Not Currently    Birth control/protection: None  Lifestyle  . Physical activity:    Days per week: Not on file    Minutes per session: Not on file  . Stress: Not on file  Relationships  . Social connections:    Talks on phone: Not on file    Gets together: Not on file    Attends religious service: Not on file    Active member of club or organization: Not on file    Attends meetings of clubs or organizations: Not on file    Relationship status: Not on file  . Intimate partner violence:    Fear of current or ex partner: Not on file    Emotionally abused: Not on file    Physically abused: Not on file    Forced sexual activity: Not on file  Other Topics Concern  . Not on file  Social History Narrative  . Not on file    Outpatient Medications Prior to Visit  Medication Sig Dispense Refill  . Vitamin D, Ergocalciferol, (DRISDOL) 1.25 MG (50000 UT) CAPS capsule Take 1 capsule (50,000 Units total) by mouth every 7 (seven) days. 12 capsule 0  . meloxicam (MOBIC) 15 MG tablet Take 1 tablet (15 mg total) by mouth daily. 30 tablet 0  No facility-administered medications prior to visit.     Allergies  Allergen Reactions  . Tetracyclines & Related     ROS Review of Systems    Objective:    Physical Exam  BP 134/86   Pulse 71   Resp 17   Ht 5\' 1"  (1.549 m)   Wt 106 lb 3.2 oz (48.2 kg)   LMP 11/15/2013   SpO2 97%   BMI 20.07 kg/m    General appearance: alert, well developed, well nourished, cooperative and in no distress Head: Normocephalic, without obvious abnormality, atraumatic Respiratory: Respirations even and unlabored, normal respiratory rate Heart: Normal heart rate and rhythm.  No gallops or murmurs. Extremities: No gross deformities. Generalized tenderness with palpation of joints present  Skin: Skin color, texture, turgor normal. No rashes seen  Psych: Appropriate mood and affect. Neurologic: Mental status: Alert, oriented to person,  place, and time, thought content appropriate.   Wt Readings from Last 3 Encounters:  09/19/18 106 lb 3.2 oz (48.2 kg)  08/29/18 105 lb 12.8 oz (48 kg)  07/27/18 120 lb (54.4 kg)     Health Maintenance Due  Topic Date Due  . TETANUS/TDAP  07/23/1989    There are no preventive care reminders to display for this patient.  Lab Results  Component Value Date   TSH 0.634 08/29/2018   Lab Results  Component Value Date   WBC 4.7 08/29/2018   HGB 13.1 08/29/2018   HCT 40.5 08/29/2018   MCV 82 08/29/2018   PLT 350 08/29/2018   Lab Results  Component Value Date   NA 141 08/29/2018   K 4.3 08/29/2018   CO2 24 08/29/2018   GLUCOSE 78 08/29/2018   BUN 8 08/29/2018   CREATININE 0.94 08/29/2018   BILITOT 1.0 08/29/2018   ALKPHOS 83 08/29/2018   AST 18 08/29/2018   ALT 14 08/29/2018   PROT 7.6 08/29/2018   ALBUMIN 4.8 08/29/2018   CALCIUM 10.1 08/29/2018   Lab Results  Component Value Date   CHOL 272 (H) 06/24/2014   Lab Results  Component Value Date   HDL 124 06/24/2014   Lab Results  Component Value Date   LDLCALC 138 (H) 06/24/2014   Lab Results  Component Value Date   TRIG 49 06/24/2014   Lab Results  Component Value Date   CHOLHDL 2.2 06/24/2014   Lab Results  Component Value Date   HGBA1C 5.5 08/29/2018      Assessment & Plan:  1. Muscle stiffness Secondary to overall vitamin D deficiency.  Encourage routine physical exercise.  Encouraged intake of foods rich in vitamin D and calcium.  Advised to give time for vitamin D level to normalize hopefully improve symptoms of muscle stiffness.  Recent labs indicated a negative sed rate and unremarkable autoimmune studies.  2. Elevated BP without diagnosis of hypertension Pressure slightly elevated today 134/86.  Hesitant to start medication today.  We will continue to monitor blood pressure readings at subsequent visits if it remains within this range or higher explained to patient it would be appropriate to go  ahead and start antihypertensive therapy.  Patient verbalized understanding.  3. Vitamin D deficiency Continue weekly ergocalciferol 50,000 units.  Encouraged to increase intake of foods fortified with vitamin D as this will also help with increasing vitamin D level.  Patient also encouraged to increase calcium in her diet  Meds ordered this encounter  Medications  . meloxicam (MOBIC) 15 MG tablet    Sig: Take 1 tablet (15 mg  total) by mouth daily.    Dispense:  30 tablet    Refill:  2    Follow-up: Return in about 3 months (around 12/18/2018) for repeat labs vit D check and f/u.    Joaquin CourtsKimberly Moneisha Vosler, FNP

## 2018-09-19 NOTE — Patient Instructions (Addendum)
I am referring you for an eye exam today at [facility] at [facility] [address] [place] [phone]  Please incorporate aerobic exercise such as walking on the treadmill, water aerobics, or stretching into daily lifestyle for bone and muscle health  Please continue to take the Vitamin D as prescribed along with calcium supplements, increase your water intake, and incorporate vit D rich foods in your diet  Vitamin D Deficiency Vitamin D deficiency is when your body does not have enough vitamin D. Vitamin D is important because:  It helps your body use other minerals that your body needs.  It helps keep your bones strong and healthy.  It may help to prevent some diseases.  It helps your heart and other muscles work well. You can get vitamin D by:  Eating foods with vitamin D in them.  Drinking or eating milk or other foods that have had vitamin D added to them.  Taking a vitamin D supplement.  Being in the sun. Not getting enough vitamin D can make your bones become soft. It can also cause other health problems. Follow these instructions at home:  Take medicines and supplements only as told by your doctor.  Eat foods that have vitamin D. These include: ? Dairy products, cereals, or juices with added vitamin D. Check the label for vitamin D. ? Fatty fish like salmon or trout. ? Eggs. ? Oysters.  Do not use tanning beds.  Stay at a healthy weight. Lose weight, if needed.  Keep all follow-up visits as told by your doctor. This is important. Contact a doctor if:  Your symptoms do not go away.  You feel sick to your stomach (nauseous).  Youthrow up (vomit).  You poop less often than usual or you have trouble pooping (constipation). This information is not intended to replace advice given to you by your health care provider. Make sure you discuss any questions you have with your health care provider. Document Released: 07/22/2011 Document Revised: 01/08/2016  Document Reviewed: 12/18/2014 Elsevier Interactive Patient Education  2019 [employer].

## 2018-09-20 NOTE — Progress Notes (Signed)
Patient ID: Laura Estes, female    DOB: [birth year], 48 y.o.   MRN: [id number]  PCP: Laura Neighbors, FNP  Chief Complaint  Patient presents with  . Muscle Pain    stiffness is about the same    Subjective:  HPI  Laura Estes is a 48 y.o. female presents for follow-up for complaints of muscle stiffness. She originally presented to the office 08/29/18 with muscle and joint stiffness.She was screened and found to have a major Vitamin D deficiency and was started on Vit D supplement once weekly as well as meloxicam for pain relief. She endorses compliance with supplement. She endorses that she has felt some improvement since starting the prescribed regimen. She endorse decrease in blurred vision, dizziness and headaches. She also endorses that she feels some relief and is able to feel a difference when she is without the meloxicam. She denies and chest pain, tightness, or shortness of breath. She endorses not increase in baseline occasional palpitations.   Social History   Socioeconomic History  . Marital status: Single    Spouse name: Not on file  . Number of children: Not on file  . Years of education: Not on file  . Highest education level: Not on file  Occupational History  . Not on file  Social Needs  . Financial resource strain: Not on file  . Food insecurity:    Worry: Not on file    Inability: Not on file  . Transportation needs:    Medical: Not on file    Non-medical: Not on file  Tobacco Use  . Smoking status: Never Smoker  . Smokeless tobacco: Never Used  Substance and Sexual Activity  . Alcohol use: No    Alcohol/week: 0.0 standard drinks  . Drug use: No  . Sexual activity: Not Currently    Birth control/protection: None  Lifestyle  . Physical activity:    Days per week: Not on file    Minutes per session: Not on file  . Stress: Not on file  Relationships  . Social connections:    Talks on phone: Not on file    Gets together: Not on file    Attends  religious service: Not on file    Active member of club or organization: Not on file    Attends meetings of clubs or organizations: Not on file    Relationship status: Not on file  . Intimate partner violence:    Fear of current or ex partner: Not on file    Emotionally abused: Not on file    Physically abused: Not on file    Forced sexual activity: Not on file  Other Topics Concern  . Not on file  Social History Narrative  . Not on file    Family History  Problem Relation Age of Onset  . Lung cancer Mother   . Hypertension Father   . Heart disease Father   . Cancer Maternal Grandmother        ovarian and uterine  . Breast cancer Maternal Aunt      Review of Systems  Constitutional: Negative for chills, fever, malaise/fatigue and weight loss.  Eyes: Positive for blurred vision. Negative for double vision, photophobia and pain.       Right eye baseline with blurred vision  Respiratory: Negative for cough, shortness of breath and wheezing.   Cardiovascular: Positive for palpitations. Negative for chest pain, orthopnea and leg swelling.  Gastrointestinal: Negative for abdominal pain, constipation, nausea and vomiting.  Musculoskeletal:  Positive for myalgias.  Neurological: Negative for dizziness and headaches.   Review of Systems  Constitutional: Negative for chills, fever, malaise/fatigue and weight loss.  Eyes: Positive for blurred vision. Negative for double vision, photophobia and pain.       Right eye baseline with blurred vision  Respiratory: Negative for cough, shortness of breath and wheezing.   Cardiovascular: Positive for palpitations. Negative for chest pain, orthopnea and leg swelling.  Gastrointestinal: Negative for abdominal pain, constipation, nausea and vomiting.  Musculoskeletal: Positive for myalgias.  Neurological: Negative for dizziness and headaches.    Patient Active Problem List   Diagnosis Date Noted  . Bacterial vaginosis 09/27/2014  .  Perimenopausal 09/26/2014  . Pap smear for cervical cancer screening 09/26/2014  . Urticaria 09/26/2014  . Dyspepsia 06/24/2014  . Elevated BP 06/24/2014    Allergies  Allergen Reactions  . Tetracyclines & Related     Prior to Admission medications   Medication Sig Start Date End Date Taking? Authorizing Provider  meloxicam (MOBIC) 15 MG tablet Take 1 tablet (15 mg total) by mouth daily. 09/19/18  Yes Laura NeighborsHarris, Kimberly S, FNP  Vitamin D, Ergocalciferol, (DRISDOL) 1.25 MG (50000 UT) CAPS capsule Take 1 capsule (50,000 Units total) by mouth every 7 (seven) days. 09/01/18  Yes Laura NeighborsHarris, Kimberly S, FNP    Past Medical, Surgical Family and Social History reviewed and updated.    Objective:   Today's Vitals   09/19/18 0938  BP: 134/86  Pulse: 71  Resp: 17  SpO2: 97%  Weight: 106 lb 3.2 oz (48.2 kg)  Height: 5\' 1"  (1.549 m)    Wt Readings from Last 3 Encounters:  09/19/18 106 lb 3.2 oz (48.2 kg)  08/29/18 105 lb 12.8 oz (48 kg)  07/27/18 120 lb (54.4 kg)     Physical Exam Constitutional:      Appearance: Normal appearance. She is normal weight.  HENT:     Head: Normocephalic and atraumatic.  Eyes:     Extraocular Movements: Extraocular movements intact.     Conjunctiva/sclera: Conjunctivae normal.     Pupils: Pupils are equal, round, and reactive to light.     Comments: Snellen chart eye exam completed, right eye 20/50 and left eye 20/30  Cardiovascular:     Rate and Rhythm: Normal rate and regular rhythm.     Pulses: Normal pulses.     Heart sounds: Normal heart sounds.  Pulmonary:     Effort: Pulmonary effort is normal.     Breath sounds: Normal breath sounds.  Musculoskeletal: Normal range of motion.        General: Tenderness present. No swelling.     Right lower leg: No edema.     Left lower leg: No edema.     Comments: Upon palpation of bilateral calves some tenderness noted,   Skin:    General: Skin is warm and dry.  Neurological:     General: No focal  deficit present.     Mental Status: She is alert and oriented to person, place, and time.  Psychiatric:        Mood and Affect: Mood normal.        Behavior: Behavior normal.        Thought Content: Thought content normal.        Judgment: Judgment normal.     No results found for: POCGLU  Lab Results  Component Value Date   HGBA1C 5.5 08/29/2018            Assessment &  Plan:  1. Muscle stiffness Encouraged to stay hydrated and consume at least 8 glass of water daily, and to incorporate aerobic physical exercise for muscle and bone strengthening.   2. Elevated BP without diagnosis of hypertension Continue to monitor blood pressure and if readings are persistently elevated greater than 130/90, will begin treatment. Encouraged to keep a home log of blood pressure readings and to follow a DASH diet.  3. Vitamin D deficiency Continue with Vitamin D and encouraged to eat food sources that are rich in Vitamin D and will recheck in 3 months.   If symptoms worsen or do not improve, return for follow-up, follow-up with PCP, or at the emergency department if severity of symptoms warrant a higher level of care.  A total of 20 minutes spent, greater than 50 % of this time was spent assessing, counseling and coordination of care.     Vincent Gros, RN Primary Care at [facility] [address], [place]: [phone]

## 2018-09-24 DIAGNOSIS — M6289 Other specified disorders of muscle: Secondary | ICD-10-CM | POA: Insufficient documentation

## 2018-12-18 ENCOUNTER — Other Ambulatory Visit: Payer: Self-pay

## 2018-12-18 ENCOUNTER — Ambulatory Visit (INDEPENDENT_AMBULATORY_CARE_PROVIDER_SITE_OTHER): Payer: Self-pay | Admitting: Family Medicine

## 2018-12-18 DIAGNOSIS — J302 Other seasonal allergic rhinitis: Secondary | ICD-10-CM

## 2018-12-18 DIAGNOSIS — M6289 Other specified disorders of muscle: Secondary | ICD-10-CM

## 2018-12-18 DIAGNOSIS — M791 Myalgia, unspecified site: Secondary | ICD-10-CM

## 2018-12-18 DIAGNOSIS — R030 Elevated blood-pressure reading, without diagnosis of hypertension: Secondary | ICD-10-CM

## 2018-12-18 DIAGNOSIS — E559 Vitamin D deficiency, unspecified: Secondary | ICD-10-CM

## 2018-12-18 MED ORDER — VITAMIN D (ERGOCALCIFEROL) 1.25 MG (50000 UNIT) PO CAPS: 50000.0000 [IU] | ORAL_CAPSULE | ORAL | 0 refills | Status: DC

## 2018-12-18 MED ORDER — MELOXICAM 15 MG PO TABS: 15.0000 mg | ORAL_TABLET | Freq: Every day | ORAL | 2 refills | Status: DC

## 2018-12-18 NOTE — Progress Notes (Deleted)
Called patient to initiate their telephone visit with provider Joaquin Courts, FNP-C. Verified date of birth. States that she ran out of the Vitamin D 1 month ago. She states that while she was on it the muscle stiffness got better. Doesn't check BP at home. KWalker, CMA.

## 2018-12-18 NOTE — Progress Notes (Signed)
Virtual Visit via Telephone Note  I connected with Laura Estes on 12/18/18 at  1:30 PM EDT by telephone and verified that I am speaking with the correct person using two identifiers.  Location: Patient: Located at home during today's encounter. Provider: Located at primary care office.     I discussed the limitations, risks, security and privacy concerns of performing an evaluation and management service by telephone and the availability of in person appointments. I also discussed with the patient that there may be a patient responsible charge related to this service. The patient expressed understanding and agreed to proceed.   History of Present Illness: Follow-up of muscle aches and muscle stiffness Patient was evaluated in office for muscle stiffness and muscle weakness back in January 2020.  She was found through lab work-up to have a vitamin D deficiency.  She was placed on weekly vitamin D replacement of 50,000 units of ergocalciferol which she has completed.  She endorsed while taking the medication symptoms resolved however once completed muscle stiffness and aches resumed.  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  Denies any joint redness.  She has full range of motion of joints and extremities.  Previous work-up included a sed rate and HIV which were both normal. No definitive cause was identified for symptoms.  Allergy Symptoms  History of asthma and allergy symptoms, although no formal diagnosis of allergies. She is negative for wheezing, cough, or shortness of breath. Endorses nasal congestion and occasional itching of eye. Take an off brand benadryl occasionally. No chronic antihistamine therapy.  Assessment and Plan: 1. Muscle stiffness -Encouraged OTC calcium, increase physical activity, and resume high dose vitamin D -Repeat vitamin D   2. Vitamin D deficiency -Continue vitamin D 50,000 units once  weekly Repeat vitamin D in 1 month.  3. Muscle ache #1 and #2  4. Elevated BP without diagnosis of hypertension -Previous reading elevated in office x1.  Advised patient to monitor blood pressure outpatient however she is currently not checking her blood pressure.  We will repeat a blood pressure check in 1 month.  Symptomatic of hypertensive symptoms.  5. Seasonal Allergies  -Recommend OTC cetrizine 10 mg once daily    Follow Up Instructions: 1 month for lab visit and blood pressure check.  Will schedule follow-up according to results of lab work and BP reading.   I discussed the assessment and treatment plan with the patient. The patient was provided an opportunity to ask questions and all were answered. The patient agreed with the plan and demonstrated an understanding of the instructions.   The patient was advised to call back or seek an in-person evaluation if the symptoms worsen or if the condition fails to improve as anticipated.  I provided 20 minutes of non-face-to-face time during this encounter.   Joaquin Courts, FNP Primary Care at [facility] [address], [place] [address] [phone]: [phone]

## 2018-12-19 ENCOUNTER — Ambulatory Visit: Payer: Self-pay | Admitting: Family Medicine

## 2019-01-01 ENCOUNTER — Other Ambulatory Visit (HOSPITAL_COMMUNITY): Payer: Self-pay | Admitting: *Deleted

## 2019-01-01 DIAGNOSIS — N6310 Unspecified lump in the right breast, unspecified quadrant: Secondary | ICD-10-CM

## 2019-01-04 ENCOUNTER — Other Ambulatory Visit: Payer: Self-pay

## 2019-01-04 ENCOUNTER — Ambulatory Visit (HOSPITAL_COMMUNITY)
Admission: RE | Admit: 2019-01-04 | Discharge: 2019-01-04 | Disposition: A | Discharge status: Home / Self Care | Payer: Self-pay | Source: Ambulatory Visit | Attending: Obstetrics and Gynecology | Admitting: Obstetrics and Gynecology

## 2019-01-04 ENCOUNTER — Encounter (HOSPITAL_COMMUNITY): Payer: Self-pay

## 2019-01-04 VITALS — BP 120/78 | Temp 99.2°F | Wt 107.0 lb

## 2019-01-04 DIAGNOSIS — N6311 Unspecified lump in the right breast, upper outer quadrant: Secondary | ICD-10-CM

## 2019-01-04 DIAGNOSIS — Z1239 Encounter for other screening for malignant neoplasm of breast: Secondary | ICD-10-CM

## 2019-01-04 NOTE — Patient Instructions (Addendum)
Explained breast self awareness with Laura Estes. Patient did not need a Pap smear today due to last Pap smear and HPV typing was 09/26/2014. Let her know B[facility]CP will cover Pap smears and HPV typing every 5 years unless has a history of abnormal Pap smears. Referred patient to the [facility] for a right breast diagnostic mammogram and possible ultrasound. Appointment scheduled for Thursday, January 18, 2019 at 1410. Patient aware of appointment and will be there. Laura Estes verbalized understanding.  [name], Kathaleen Maser, RN 2:09 PM

## 2019-01-04 NOTE — Progress Notes (Signed)
Complaints of right breast lump since October 2019 that has increased in size.  Pap Smear: Pap smear not completed today. Pap smear not completed today. Last Pap smear was 09/26/2014 at [facility] and normal with negative HPV. Per patient has no history of an abnormal Pap smear.  Physical exam: Breasts Breasts symmetrical. No skin abnormalities bilateral breasts. No nipple retraction bilateral breasts. No nipple discharge bilateral breasts. No lymphadenopathy. No lumps palpated left breast. Palpated a pea sized lump within the right breast at 10 o'clock 7 cm from the nipple. No complaints of pain or tenderness on exam. Referred patient to the [facility] for a right breast diagnostic mammogram and possible ultrasound. Appointment scheduled for Thursday, January 18, 2019 at 1410.        Pelvic/Bimanual No Pap smear completed today since last Pap smearand HPV typing was 09/26/2014. Pap smear not indicated per BCCCP guidelines.  Smoking History: Patient has never smoked.  Patient Navigation: Patient education provided. Access to services provided for patient through BCCCP program.   Breast and Cervical Cancer Risk Assessment: Patient has a family history of a maternal aunt and a maternal cousin with breast cancer. Patient has no known genetic mutations or history of radiation treatment to the chest before age 20. Patient has no history of cervical dysplasia, immunocompromised, or DES exposure in-utero.  Risk Assessment    No risk assessment data for the current encounter   Risk Scores      03/28/2018   Last edited by: Priscille Heidelberg, RN   5-year risk: 1 %   Lifetime risk: 9 %

## 2019-01-15 ENCOUNTER — Other Ambulatory Visit: Payer: Self-pay

## 2019-01-15 ENCOUNTER — Ambulatory Visit (INDEPENDENT_AMBULATORY_CARE_PROVIDER_SITE_OTHER): Payer: Self-pay

## 2019-01-15 ENCOUNTER — Other Ambulatory Visit: Payer: Self-pay | Admitting: Family Medicine

## 2019-01-15 DIAGNOSIS — R7989 Other specified abnormal findings of blood chemistry: Secondary | ICD-10-CM

## 2019-01-15 DIAGNOSIS — M791 Myalgia, unspecified site: Secondary | ICD-10-CM

## 2019-01-15 DIAGNOSIS — E559 Vitamin D deficiency, unspecified: Secondary | ICD-10-CM

## 2019-01-15 DIAGNOSIS — M6289 Other specified disorders of muscle: Secondary | ICD-10-CM

## 2019-01-15 DIAGNOSIS — Z0131 Encounter for examination of blood pressure with abnormal findings: Secondary | ICD-10-CM

## 2019-01-15 NOTE — Progress Notes (Signed)
Patient here for BP check & labs. After sitting BP was 133/86 & pulse 80. Spoke with provider and she states that follow up will be based on lab results. KWalker, CMA.

## 2019-01-15 NOTE — Progress Notes (Signed)
Pending future thyroid only

## 2019-01-16 LAB — VITAMIN D 25 HYDROXY (VIT D DEFICIENCY, FRACTURES): Vit D, 25-Hydroxy: 116 ng/mL — ABNORMAL HIGH (ref 30.0–100.0)

## 2019-01-16 LAB — TSH: TSH: 0.744 u[IU]/mL (ref 0.450–4.500)

## 2019-01-17 ENCOUNTER — Telehealth: Payer: Self-pay | Admitting: Family Medicine

## 2019-01-17 NOTE — Telephone Encounter (Signed)
Pt called back for her lab results, please follow up thanks

## 2019-01-18 ENCOUNTER — Other Ambulatory Visit: Payer: Self-pay

## 2019-01-18 ENCOUNTER — Ambulatory Visit
Admission: RE | Admit: 2019-01-18 | Discharge: 2019-01-18 | Disposition: A | Discharge status: Home / Self Care | Payer: Self-pay | Source: Ambulatory Visit | Attending: Obstetrics and Gynecology | Admitting: Obstetrics and Gynecology

## 2019-01-18 DIAGNOSIS — N6310 Unspecified lump in the right breast, unspecified quadrant: Secondary | ICD-10-CM

## 2019-02-27 ENCOUNTER — Other Ambulatory Visit (HOSPITAL_COMMUNITY): Payer: Self-pay | Admitting: *Deleted

## 2019-02-27 DIAGNOSIS — Z1231 Encounter for screening mammogram for malignant neoplasm of breast: Secondary | ICD-10-CM

## 2019-03-06 ENCOUNTER — Telehealth: Payer: Self-pay

## 2019-03-06 NOTE — Telephone Encounter (Signed)
Called patient to inform her that we would be moving her from BCCCP to mammo scholarship. Gave patient BCG appointment. Patient voiced understanding.  °

## 2019-04-17 ENCOUNTER — Other Ambulatory Visit: Payer: Self-pay

## 2019-04-17 ENCOUNTER — Ambulatory Visit
Admission: RE | Admit: 2019-04-17 | Discharge: 2019-04-17 | Disposition: A | Discharge status: Home / Self Care | Payer: No Typology Code available for payment source | Source: Ambulatory Visit | Attending: Obstetrics and Gynecology | Admitting: Obstetrics and Gynecology

## 2019-04-17 ENCOUNTER — Ambulatory Visit (HOSPITAL_COMMUNITY): Payer: Self-pay

## 2019-04-17 DIAGNOSIS — Z1231 Encounter for screening mammogram for malignant neoplasm of breast: Secondary | ICD-10-CM

## 2019-04-19 ENCOUNTER — Other Ambulatory Visit: Payer: Self-pay | Admitting: Obstetrics and Gynecology

## 2019-04-19 DIAGNOSIS — R928 Other abnormal and inconclusive findings on diagnostic imaging of breast: Secondary | ICD-10-CM

## 2019-04-30 ENCOUNTER — Ambulatory Visit
Admission: RE | Admit: 2019-04-30 | Discharge: 2019-04-30 | Disposition: A | Discharge status: Home / Self Care | Payer: No Typology Code available for payment source | Source: Ambulatory Visit | Attending: Obstetrics and Gynecology | Admitting: Obstetrics and Gynecology

## 2019-04-30 ENCOUNTER — Ambulatory Visit: Payer: No Typology Code available for payment source

## 2019-04-30 ENCOUNTER — Other Ambulatory Visit: Payer: Self-pay

## 2019-04-30 DIAGNOSIS — R928 Other abnormal and inconclusive findings on diagnostic imaging of breast: Secondary | ICD-10-CM

## 2019-08-30 ENCOUNTER — Telehealth: Payer: Self-pay

## 2019-08-30 NOTE — Telephone Encounter (Signed)

## 2019-08-31 ENCOUNTER — Other Ambulatory Visit: Payer: Self-pay

## 2019-08-31 ENCOUNTER — Ambulatory Visit (INDEPENDENT_AMBULATORY_CARE_PROVIDER_SITE_OTHER): Payer: Managed Care, Other (non HMO) | Admitting: Internal Medicine

## 2019-08-31 VITALS — BP 150/90 | HR 112 | Temp 97.5°F | Resp 17 | Ht 61.0 in | Wt 103.4 lb

## 2019-08-31 DIAGNOSIS — D72819 Decreased white blood cell count, unspecified: Secondary | ICD-10-CM | POA: Diagnosis not present

## 2019-08-31 DIAGNOSIS — I10 Essential (primary) hypertension: Secondary | ICD-10-CM

## 2019-08-31 DIAGNOSIS — Z13228 Encounter for screening for other metabolic disorders: Secondary | ICD-10-CM | POA: Diagnosis not present

## 2019-08-31 DIAGNOSIS — R7989 Other specified abnormal findings of blood chemistry: Secondary | ICD-10-CM

## 2019-08-31 MED ORDER — AMLODIPINE BESYLATE 5 MG PO TABS: 5.0000 mg | ORAL_TABLET | Freq: Every day | ORAL | 0 refills | Status: DC

## 2019-08-31 NOTE — Progress Notes (Signed)
  Subjective:    Laura Estes - 49 y.o. female MRN [id number]  Date of birth: November 16, 1970  HPI  Laura Estes is here for concern about blood pressure and lab work she had done at a screening visit she had done at [employer], where she works. Her BP was high at this screening visit and has been elevated at prior office visits. Additionally, her Cr was elevated and her WBCs were low.     Health Maintenance Due  Topic Date Due  . PAP SMEAR-Modifier  09/27/2019    -  reports that she has never smoked. She has never used smokeless tobacco. - Review of Systems: Per HPI. - Past Medical History: Patient Active Problem List   Diagnosis Date Noted  . Perimenopausal 09/26/2014  . Dyspepsia 06/24/2014  . Elevated BP 06/24/2014   - Medications: reviewed and updated   Objective:   Physical Exam BP (!) 150/90   Pulse (!) 112   Temp (!) 97.5 F (36.4 C) (Temporal)   Resp 17   Ht 5\' 1"  (1.549 m)   Wt 103 lb 6.4 oz (46.9 kg)   LMP 11/15/2013   SpO2 99%   BMI 19.54 kg/m  Physical Exam  Constitutional: She is well-developed, well-nourished, and in no distress. No distress.  Eyes: Conjunctivae and EOM are normal.  Cardiovascular: Normal rate, regular rhythm and normal heart sounds.  Pulmonary/Chest: Effort normal and breath sounds normal. No respiratory distress. She has no wheezes. She has no rales.  Musculoskeletal:     Cervical back: Normal range of motion and neck supple.  Skin: Skin is warm and dry. She is not diaphoretic.  Psychiatric: Affect and judgment normal.           Assessment & Plan:   1. Screening for metabolic disorder - Comprehensive metabolic panel - Lipid Panel  2. Essential hypertension Counseled on blood pressure goal of less than 130/80, low-sodium, DASH diet, medication compliance, 150 minutes of moderate intensity exercise per week. Discussed medication compliance, adverse effects. Start Amlodipine 5 mg, titrate up as needed. Follow up in 1 month for BP  check.  - Comprehensive metabolic panel - amLODipine (NORVASC) 5 MG tablet; Take 1 tablet (5 mg total) by mouth daily.  Dispense: 90 tablet; Refill: 0  3. Leukopenia, unspecified type WBC was 3.6 on screening labs.  - CBC  4. Elevated serum creatinine Cr 1.11 on screening labs. Was 0.94 in Jan 2020. Potentially starting to see some impact from elevated BPs on the kidneys. Will repeat CMET today.      Feb 2020, D.O. 08/31/2019, 9:23 AM Primary Care at [facility]

## 2019-08-31 NOTE — Patient Instructions (Signed)
I have started you on Amlodipine for your blood pressure. Take this medication once per day at the same time each day. If you can get a blood pressure cuff to monitor at home or have a friend take it if there is one at work that would helpful too.   We will contact you next week regarding your lab results.   Take Care,  Dr. Earlene Plater

## 2019-09-01 LAB — COMPREHENSIVE METABOLIC PANEL
ALT: 15 IU/L (ref 0–32)
AST: 21 IU/L (ref 0–40)
Albumin/Globulin Ratio: 2 (ref 1.2–2.2)
Albumin: 4.9 g/dL — ABNORMAL HIGH (ref 3.8–4.8)
Alkaline Phosphatase: 89 IU/L (ref 39–117)
BUN/Creatinine Ratio: 7 — ABNORMAL LOW (ref 9–23)
BUN: 8 mg/dL (ref 6–24)
Bilirubin Total: 0.8 mg/dL (ref 0.0–1.2)
CO2: 25 mmol/L (ref 20–29)
Calcium: 10.1 mg/dL (ref 8.7–10.2)
Chloride: 103 mmol/L (ref 96–106)
Creatinine, Ser: 1.15 mg/dL — ABNORMAL HIGH (ref 0.57–1.00)
GFR calc Af Amer: 65 mL/min/{1.73_m2} (ref >59)
GFR calc non Af Amer: 56 mL/min/{1.73_m2} — ABNORMAL LOW (ref >59)
Globulin, Total: 2.5 g/dL (ref 1.5–4.5)
Glucose: 86 mg/dL (ref 65–99)
Potassium: 4.7 mmol/L (ref 3.5–5.2)
Sodium: 142 mmol/L (ref 134–144)
Total Protein: 7.4 g/dL (ref 6.0–8.5)

## 2019-09-01 LAB — CBC
Hematocrit: 41.3 % (ref 34.0–46.6)
Hemoglobin: 13.2 g/dL (ref 11.1–15.9)
MCH: 26.5 pg — ABNORMAL LOW (ref 26.6–33.0)
MCHC: 32 g/dL (ref 31.5–35.7)
MCV: 83 fL (ref 79–97)
Platelets: 333 10*3/uL (ref 150–450)
RBC: 4.98 x10E6/uL (ref 3.77–5.28)
RDW: 12.8 % (ref 11.7–15.4)
WBC: 3.5 10*3/uL (ref 3.4–10.8)

## 2019-09-01 LAB — LIPID PANEL
Chol/HDL Ratio: 2.4 ratio (ref 0.0–4.4)
Cholesterol, Total: 294 mg/dL — ABNORMAL HIGH (ref 100–199)
HDL: 124 mg/dL (ref >39)
LDL Chol Calc (NIH): 162 mg/dL — ABNORMAL HIGH (ref 0–99)
Triglycerides: 59 mg/dL (ref 0–149)
VLDL Cholesterol Cal: 8 mg/dL (ref 5–40)

## 2019-09-03 ENCOUNTER — Other Ambulatory Visit: Payer: Self-pay | Admitting: Internal Medicine

## 2019-09-03 DIAGNOSIS — R7989 Other specified abnormal findings of blood chemistry: Secondary | ICD-10-CM

## 2019-09-04 NOTE — Progress Notes (Signed)
Patient notified of results & recommendations. Expressed understanding. Made lab appointment for 09/14/2019 @ 9 AM.

## 2019-09-14 ENCOUNTER — Other Ambulatory Visit: Payer: Self-pay

## 2019-09-14 ENCOUNTER — Other Ambulatory Visit: Payer: Managed Care, Other (non HMO)

## 2019-09-14 DIAGNOSIS — R7989 Other specified abnormal findings of blood chemistry: Secondary | ICD-10-CM

## 2019-09-14 NOTE — Progress Notes (Signed)
Patient here for repeat BMP 

## 2019-09-15 LAB — BASIC METABOLIC PANEL
BUN/Creatinine Ratio: 9 (ref 9–23)
BUN: 9 mg/dL (ref 6–24)
CO2: 24 mmol/L (ref 20–29)
Calcium: 9.9 mg/dL (ref 8.7–10.2)
Chloride: 107 mmol/L — ABNORMAL HIGH (ref 96–106)
Creatinine, Ser: 1.04 mg/dL — ABNORMAL HIGH (ref 0.57–1.00)
GFR calc Af Amer: 73 mL/min/{1.73_m2} (ref >59)
GFR calc non Af Amer: 63 mL/min/{1.73_m2} (ref >59)
Glucose: 87 mg/dL (ref 65–99)
Potassium: 4.5 mmol/L (ref 3.5–5.2)
Sodium: 145 mmol/L — ABNORMAL HIGH (ref 134–144)

## 2019-09-17 NOTE — Progress Notes (Signed)
Patient notified of results & recommendations. Expressed understanding.

## 2019-10-05 ENCOUNTER — Ambulatory Visit: Payer: No Typology Code available for payment source | Admitting: Internal Medicine

## 2019-10-25 ENCOUNTER — Telehealth: Payer: Self-pay

## 2019-10-25 NOTE — Patient Instructions (Signed)

## 2019-10-25 NOTE — Telephone Encounter (Signed)

## 2019-10-26 ENCOUNTER — Ambulatory Visit (INDEPENDENT_AMBULATORY_CARE_PROVIDER_SITE_OTHER): Payer: 59 | Admitting: Internal Medicine

## 2019-10-26 ENCOUNTER — Other Ambulatory Visit: Payer: Self-pay

## 2019-10-26 ENCOUNTER — Other Ambulatory Visit (HOSPITAL_COMMUNITY)
Admission: RE | Admit: 2019-10-26 | Discharge: 2019-10-26 | Disposition: A | Discharge status: Home / Self Care | Payer: 59 | Source: Ambulatory Visit | Attending: Internal Medicine | Admitting: Internal Medicine

## 2019-10-26 VITALS — BP 117/78 | HR 80 | Temp 97.3°F | Resp 17 | Ht 61.0 in | Wt 100.0 lb

## 2019-10-26 DIAGNOSIS — Z124 Encounter for screening for malignant neoplasm of cervix: Secondary | ICD-10-CM | POA: Diagnosis present

## 2019-10-26 DIAGNOSIS — I10 Essential (primary) hypertension: Secondary | ICD-10-CM | POA: Diagnosis not present

## 2019-10-26 NOTE — Progress Notes (Signed)
  Subjective:    Laura Estes - 49 y.o. female MRN [id number]  Date of birth: 1971-01-15  HPI  Laura Estes is here for follow up on HTN and PAP. No history of abnormal PAP. Declines STD screening. No pelvic concerns.    Chronic HTN Disease Monitoring:  Home BP Monitoring - Monitors at home. But not sure if accurate and forgot to bring BP cuff.  Chest pain- no  Dyspnea- no Headache - no  Medications: Amlodipine 5 mg  Compliance- yes Lightheadedness- no  Edema- no   Health Maintenance:  Health Maintenance Due  Topic Date Due  . PAP SMEAR-Modifier  09/27/2019    -  reports that she has never smoked. She has never used smokeless tobacco. - Review of Systems: Per HPI. - Past Medical History: Patient Active Problem List   Diagnosis Date Noted  . Perimenopausal 09/26/2014  . Dyspepsia 06/24/2014  . Elevated BP 06/24/2014   - Medications: reviewed and updated   Objective:   Physical Exam BP 117/78   Pulse 80   Temp (!) 97.3 F (36.3 C) (Temporal)   Resp 17   Ht 5\' 1"  (1.549 m)   Wt 100 lb (45.4 kg)   LMP 11/15/2013   SpO2 98%   BMI 18.89 kg/m  Physical Exam  Constitutional: She is oriented to person, place, and time and well-developed, well-nourished, and in no distress. No distress.  Cardiovascular: Normal rate.  Pulmonary/Chest: Effort normal. No respiratory distress.  Genitourinary:    Genitourinary Comments:  Exam performed in the presence of a chaperone. External genitalia within normal limits.  Vaginal mucosa pink, moist, normal rugae.  Nonfriable cervix without lesions, no discharge  noted on speculum exam. Minimal bleeding after obtaining sample. Bimanual exam revealed normal, nongravid uterus.  No cervical motion tenderness. No adnexal masses bilaterally.     Musculoskeletal:        General: Normal range of motion.  Neurological: She is alert and oriented to person, place, and time.  Skin: Skin is warm and dry. She is not diaphoretic.   Psychiatric: Affect and judgment normal.           Assessment & Plan:   1. Essential hypertension BP well controlled today and at goal. Asymptomatic. Continue Amlodipine 5 mg.  Counseled on blood pressure goal of less than 130/80, low-sodium, DASH diet, medication compliance, 150 minutes of moderate intensity exercise per week. Discussed medication compliance, adverse effects.  2. Pap smear for cervical cancer screening - Cytology - PAP([facility])     01/15/2014, D.O. 10/26/2019, 11:01 AM Primary Care at [facility]

## 2019-10-30 LAB — CYTOLOGY - PAP
Comment: NEGATIVE
Diagnosis: UNDETERMINED — AB
High risk HPV: NEGATIVE

## 2019-11-01 NOTE — Progress Notes (Signed)
Patient notified of results & recommendations. Expressed understanding. Did make follow up appointment for repeat pap smear.

## 2019-11-21 ENCOUNTER — Other Ambulatory Visit: Payer: Self-pay

## 2019-11-21 DIAGNOSIS — I10 Essential (primary) hypertension: Secondary | ICD-10-CM

## 2019-11-22 MED ORDER — AMLODIPINE BESYLATE 5 MG PO TABS: 5.0000 mg | ORAL_TABLET | Freq: Every day | ORAL | 1 refills | Status: DC

## 2020-02-20 ENCOUNTER — Telehealth: Payer: Self-pay | Admitting: Internal Medicine

## 2020-02-20 NOTE — Telephone Encounter (Signed)
1) Medication(s) Requested (by name):amLODipine (NORVASC) 5 MG tablet [[id number]]    2) Pharmacy of Choice: [facility] - [place], [place] - [address]  [address],   3) Special Requests:   Approved medications will be sent to the pharmacy, we will reach out if there is an issue.  Requests made after 3pm may not be addressed until the following business day!  If a patient is unsure of the name of the medication(s) please note and ask patient to call back when they are able to provide all info, do not send to responsible party until all information is available!

## 2020-02-20 NOTE — Telephone Encounter (Signed)
#  90 with 1 refill prescribed on 11/22/2019. Patient needs to contact pharmacy to have them get refill ready for pickup.

## 2020-03-19 ENCOUNTER — Other Ambulatory Visit: Payer: Self-pay | Admitting: Internal Medicine

## 2020-03-19 DIAGNOSIS — Z1231 Encounter for screening mammogram for malignant neoplasm of breast: Secondary | ICD-10-CM

## 2020-04-17 ENCOUNTER — Ambulatory Visit
Admission: RE | Admit: 2020-04-17 | Discharge: 2020-04-17 | Disposition: A | Discharge status: Home / Self Care | Payer: 59 | Source: Ambulatory Visit | Attending: Internal Medicine | Admitting: Internal Medicine

## 2020-04-17 ENCOUNTER — Other Ambulatory Visit: Payer: Self-pay

## 2020-04-17 DIAGNOSIS — Z1231 Encounter for screening mammogram for malignant neoplasm of breast: Secondary | ICD-10-CM

## 2020-05-02 ENCOUNTER — Encounter: Payer: Self-pay | Admitting: Internal Medicine

## 2020-05-02 ENCOUNTER — Telehealth (INDEPENDENT_AMBULATORY_CARE_PROVIDER_SITE_OTHER): Payer: 59 | Admitting: Internal Medicine

## 2020-05-02 DIAGNOSIS — Z862 Personal history of diseases of the blood and blood-forming organs and certain disorders involving the immune mechanism: Secondary | ICD-10-CM | POA: Diagnosis not present

## 2020-05-02 DIAGNOSIS — I10 Essential (primary) hypertension: Secondary | ICD-10-CM

## 2020-05-02 DIAGNOSIS — R252 Cramp and spasm: Secondary | ICD-10-CM | POA: Diagnosis not present

## 2020-05-02 DIAGNOSIS — E559 Vitamin D deficiency, unspecified: Secondary | ICD-10-CM | POA: Diagnosis not present

## 2020-05-02 MED ORDER — AMLODIPINE BESYLATE 5 MG PO TABS: 5.0000 mg | ORAL_TABLET | Freq: Every day | ORAL | 1 refills | Status: DC

## 2020-05-02 NOTE — Progress Notes (Signed)
Virtual Visit via Telephone Note  I connected with Laura Estes, on 05/02/2020 at 9:09 AM by telephone due to the COVID-19 pandemic and verified that I am speaking with the correct person using two identifiers.   Consent: I discussed the limitations, risks, security and privacy concerns of performing an evaluation and management service by telephone and the availability of in person appointments. I also discussed with the patient that there may be a patient responsible charge related to this service. The patient expressed understanding and agreed to proceed.   Location of Patient: Home   Location of Provider: Home    Persons participating in Telemedicine visit: Hermine Terilynn Buresh [facility] Dr. Earlene Plater      History of Present Illness: Patient has a visit to f/u on HTN. Patient takes Amlodipine 5 mg. Reports it was 148 systolic at dentist recently but that at home has been 110-120/80.   Also wants to check some labs due to muscle cramping and mild dizziness. Was previously taking Fe supplements for anemia but became very constipated so stopped. She does not eat any meat.   Wants to check on Vit D level as well.    Past Medical History:  Diagnosis Date  . Hypertension    Allergies  Allergen Reactions  . Tetracyclines & Related     Current Outpatient Medications on File Prior to Visit  Medication Sig Dispense Refill  . amLODipine (NORVASC) 5 MG tablet Take 1 tablet (5 mg total) by mouth daily. 90 tablet 1   No current facility-administered medications on file prior to visit.    Observations/Objective: NAD. Speaking clearly.  Work of breathing normal.  Alert and oriented. Mood appropriate.   Assessment and Plan: 1. Essential hypertension BP sounds at goal with home monitoring but patient is very worried that cuff might not be accurate. Continue Amlodipine. Will plan to check BP at upcoming lab visit on RN schedule. Patient to bring her own cuff to visit for  comparison. No red flag symptoms.  - amLODipine (NORVASC) 5 MG tablet; Take 1 tablet (5 mg total) by mouth daily.  Dispense: 90 tablet; Refill: 1  2. Muscle cramps - VITAMIN D 25 Hydroxy (Vit-D Deficiency, Fractures); Future - Vitamin B12; Future - Basic metabolic panel; Future - TSH; Future - CBC; Future - Magnesium; Future  3. Vitamin D deficiency Patient with Vit D level of 8 in Jan 2020 but on repeat labs found to have high level with result of 116 in June 2020. Repeat to monitor.  - VITAMIN D 25 Hydroxy (Vit-D Deficiency, Fractures); Future  4. History of anemia Previous HgB 13.2. However patient reports history of iron deficiency anemia related to her diet. Monitor CBC.  - CBC; Future   Follow Up Instructions: Labs and BP check 9/20    I discussed the assessment and treatment plan with the patient. The patient was provided an opportunity to ask questions and all were answered. The patient agreed with the plan and demonstrated an understanding of the instructions.   The patient was advised to call back or seek an in-person evaluation if the symptoms worsen or if the condition fails to improve as anticipated.     I provided 20 minutes total of non-face-to-face time during this encounter including median intraservice time, reviewing previous notes, investigations, ordering medications, medical decision making, coordinating care and patient verbalized understanding at the end of the visit.    Marcy Siren, D.O. Primary Care at [facility]  05/02/2020, 9:09 AM

## 2020-05-05 ENCOUNTER — Ambulatory Visit: Payer: 59

## 2020-05-05 ENCOUNTER — Other Ambulatory Visit: Payer: Self-pay

## 2020-05-05 DIAGNOSIS — E559 Vitamin D deficiency, unspecified: Secondary | ICD-10-CM

## 2020-05-05 DIAGNOSIS — Z862 Personal history of diseases of the blood and blood-forming organs and certain disorders involving the immune mechanism: Secondary | ICD-10-CM

## 2020-05-05 DIAGNOSIS — R252 Cramp and spasm: Secondary | ICD-10-CM

## 2020-05-05 NOTE — Progress Notes (Signed)
Patient here for BP check. Has taken BP medication this morning. After sitting BP was 133/88, pulse 92. Reading was similar with home BP cuff. KWalker, CMA.

## 2020-05-06 LAB — CBC
Hematocrit: 43.3 % (ref 34.0–46.6)
Hemoglobin: 13.4 g/dL (ref 11.1–15.9)
MCH: 26.8 pg (ref 26.6–33.0)
MCHC: 30.9 g/dL — ABNORMAL LOW (ref 31.5–35.7)
MCV: 87 fL (ref 79–97)
Platelets: 284 10*3/uL (ref 150–450)
RBC: 5 x10E6/uL (ref 3.77–5.28)
RDW: 13.4 % (ref 11.7–15.4)
WBC: 3.6 10*3/uL (ref 3.4–10.8)

## 2020-05-06 LAB — BASIC METABOLIC PANEL
BUN/Creatinine Ratio: 10 (ref 9–23)
BUN: 11 mg/dL (ref 6–24)
CO2: 24 mmol/L (ref 20–29)
Calcium: 9.7 mg/dL (ref 8.7–10.2)
Chloride: 103 mmol/L (ref 96–106)
Creatinine, Ser: 1.05 mg/dL — ABNORMAL HIGH (ref 0.57–1.00)
GFR calc Af Amer: 72 mL/min/{1.73_m2} (ref >59)
GFR calc non Af Amer: 63 mL/min/{1.73_m2} (ref >59)
Glucose: 85 mg/dL (ref 65–99)
Potassium: 4.3 mmol/L (ref 3.5–5.2)
Sodium: 141 mmol/L (ref 134–144)

## 2020-05-06 LAB — VITAMIN D 25 HYDROXY (VIT D DEFICIENCY, FRACTURES): Vit D, 25-Hydroxy: 39.5 ng/mL (ref 30.0–100.0)

## 2020-05-06 LAB — VITAMIN B12: Vitamin B-12: 418 pg/mL (ref 232–1245)

## 2020-05-06 LAB — TSH: TSH: 0.596 u[IU]/mL (ref 0.450–4.500)

## 2020-05-06 LAB — MAGNESIUM: Magnesium: 2 mg/dL (ref 1.6–2.3)

## 2020-05-16 ENCOUNTER — Telehealth: Payer: Self-pay

## 2020-05-16 NOTE — Telephone Encounter (Signed)
Patient would like further work up(additional labs or imaging) since her recent bloodwork came back normal. She states that she still feels "horrible" with the cramping, dizziness & fatigue.  Please advise.

## 2020-05-29 ENCOUNTER — Other Ambulatory Visit: Payer: Self-pay | Admitting: Internal Medicine

## 2020-05-29 DIAGNOSIS — R42 Dizziness and giddiness: Secondary | ICD-10-CM

## 2020-06-29 ENCOUNTER — Ambulatory Visit
Admission: EM | Admit: 2020-06-29 | Discharge: 2020-06-29 | Disposition: A | Discharge status: Home / Self Care | Payer: 59

## 2020-06-29 ENCOUNTER — Encounter: Payer: Self-pay | Admitting: Emergency Medicine

## 2020-06-29 ENCOUNTER — Other Ambulatory Visit: Payer: Self-pay

## 2020-06-29 DIAGNOSIS — J3489 Other specified disorders of nose and nasal sinuses: Secondary | ICD-10-CM

## 2020-06-29 DIAGNOSIS — I10 Essential (primary) hypertension: Secondary | ICD-10-CM

## 2020-06-29 DIAGNOSIS — R202 Paresthesia of skin: Secondary | ICD-10-CM | POA: Diagnosis not present

## 2020-06-29 DIAGNOSIS — R42 Dizziness and giddiness: Secondary | ICD-10-CM

## 2020-06-29 NOTE — Discharge Instructions (Addendum)
Flonase: 2 sprays in each nostril once daily. May take allergy medication as well. Important to drink plenty of fluids. Keep blood pressure log as discussed and reviewed PCP in 1-2 weeks. Important to upload photos of your recent lipid panel to discussed with PCP as well. Keep neurology appointment, may call weekly to check waitlist status.

## 2020-06-29 NOTE — ED Provider Notes (Signed)
[facility]    CSN: [id number] Arrival date & time: 06/29/20  1230      History   Chief Complaint Chief Complaint  Patient presents with  . Hypertension    HPI Laura Estes is a 49 y.o. female  With history as below presenting for elevated blood pressure readings at home, right-sided headaches, bilateral hand paresthesias, lower leg cramping.  States this is been ongoing for several months.  Has been evaluated by PCP: Has neurology appointment pending in December.  Denies worsening of condition.  States she is had some bilateral ear aching as well without sore throat, nasal congestion.  Past Medical History:  Diagnosis Date  . Hypertension     Patient Active Problem List   Diagnosis Date Noted  . Perimenopausal 09/26/2014  . Dyspepsia 06/24/2014  . Elevated BP 06/24/2014    History reviewed. No pertinent surgical history.  OB History    Gravida  1   Para      Term      Preterm      AB  1   Living  0     SAB      TAB  1   Ectopic      Multiple      Live Births  0            Home Medications    Prior to Admission medications   Medication Sig Start Date End Date Taking? Authorizing Provider  amLODipine (NORVASC) 5 MG tablet Take 1 tablet (5 mg total) by mouth daily. 05/02/20   Arvilla Market, DO    Family History Family History  Problem Relation Age of Onset  . Lung cancer Mother   . Hypertension Father   . Heart disease Father   . Cancer Maternal Grandmother        ovarian and uterine  . Breast cancer Maternal Aunt   . Breast cancer Cousin     Social History Social History   Tobacco Use  . Smoking status: Never Smoker  . Smokeless tobacco: Never Used  Vaping Use  . Vaping Use: Never used  Substance Use Topics  . Alcohol use: No    Alcohol/week: 0.0 standard drinks  . Drug use: No     Allergies   Tetracyclines & related   Review of Systems Review of Systems  Constitutional: Negative for  fatigue and fever.  HENT: Positive for ear pain. Negative for ear discharge, sinus pain, sore throat and voice change.   Eyes: Negative for pain, redness and visual disturbance.  Respiratory: Negative for cough and shortness of breath.   Cardiovascular: Negative for chest pain and palpitations.  Gastrointestinal: Negative for abdominal pain, diarrhea and vomiting.  Musculoskeletal: Negative for arthralgias and myalgias.  Skin: Negative for rash and wound.  Neurological: Positive for dizziness, numbness and headaches. Negative for syncope.       Chronic, stable     Physical Exam Triage Vital Signs ED Triage Vitals  Enc Vitals Group     BP 06/29/20 1247 (!) 147/95     Pulse Rate 06/29/20 1247 (!) 116     Resp 06/29/20 1247 18     Temp 06/29/20 1247 98.5 F (36.9 C)     Temp Source 06/29/20 1247 Oral     SpO2 06/29/20 1247 100 %     Weight --      Height --      Head Circumference --      Peak Flow --  Pain Score 06/29/20 1249 4     Pain Loc --      Pain Edu? --      Excl. in GC? --    No data found.  Updated Vital Signs BP (!) 147/95 (BP Location: Left Arm)   Pulse (!) 116   Temp 98.5 F (36.9 C) (Oral)   Resp 18   LMP 11/15/2013   SpO2 100%   Visual Acuity Right Eye Distance:   Left Eye Distance:   Bilateral Distance:    Right Eye Near:   Left Eye Near:    Bilateral Near:     Physical Exam Constitutional:      General: She is not in acute distress.    Appearance: She is normal weight. She is not ill-appearing.  HENT:     Head: Normocephalic and atraumatic.     Right Ear: Tympanic membrane and ear canal normal.     Left Ear: Tympanic membrane and ear canal normal.     Nose:     Comments: Bilateral turbinate edema with pink mucosa.  Negative sinus tenderness.    Mouth/Throat:     Mouth: Mucous membranes are moist.     Pharynx: Oropharynx is clear.  Eyes:     General: No scleral icterus.    Pupils: Pupils are equal, round, and reactive to light.   Cardiovascular:     Rate and Rhythm: Normal rate and regular rhythm.  Pulmonary:     Effort: Pulmonary effort is normal. No respiratory distress.     Breath sounds: No wheezing or rales.  Skin:    Capillary Refill: Capillary refill takes less than 2 seconds.     Coloration: Skin is not jaundiced or pale.  Neurological:     General: No focal deficit present.     Mental Status: She is alert and oriented to person, place, and time.      UC Treatments / Results  Labs (all labs ordered are listed, but only abnormal results are displayed) Labs Reviewed - No data to display  EKG   Radiology No results found.  Procedures Procedures (including critical care time)  Medications Ordered in UC Medications - No data to display  Initial Impression / Assessment and Plan / UC Course  I have reviewed the triage vital signs and the nursing notes.  Pertinent labs & imaging results that were available during my care of the patient were reviewed by me and considered in my medical decision making (see chart for details).     Extensive chart review done at time of patient's appointment.  Dizziness and paresthesias have been chronic, largely unchanged.  Neuro exam is nonfocal at this time.  PCP recently sent referral to neurology and patient has an appointment pending.  Blood pressure is elevated, though routinely followed by her PCP for this.  Discussed utility of taking blood pressure log, using MyChart to communicate with PCP in between appointments.  Will defer to PCP for further hypertensive evaluation and management.  Trial Flonase for turbinate edema which could be contributing to ear pain.  Return precautions discussed, pt verbalized understanding and is agreeable to plan. Final Clinical Impressions(s) / UC Diagnoses   Final diagnoses:  Essential hypertension  Dizziness  Paresthesia of both hands  Sinus pressure     Discharge Instructions     Flonase: 2 sprays in each nostril once  daily. May take allergy medication as well. Important to drink plenty of fluids. Keep blood pressure log as discussed and reviewed  PCP in 1-2 weeks. Important to upload photos of your recent lipid panel to discussed with PCP as well. Keep neurology appointment, may call weekly to check waitlist status.    ED Prescriptions    None     PDMP not reviewed this encounter.   [place], [place], [place] 06/29/20 1430

## 2020-06-29 NOTE — ED Triage Notes (Signed)
Pt here for htn and some HA x months; pt sts taking her medication; pt sts some pain in her ears

## 2020-08-12 ENCOUNTER — Ambulatory Visit (INDEPENDENT_AMBULATORY_CARE_PROVIDER_SITE_OTHER): Payer: 59 | Admitting: Neurology

## 2020-08-12 ENCOUNTER — Encounter: Payer: Self-pay | Admitting: Neurology

## 2020-08-12 VITALS — BP 144/84 | HR 97 | Ht 61.5 in | Wt 106.8 lb

## 2020-08-12 DIAGNOSIS — R252 Cramp and spasm: Secondary | ICD-10-CM | POA: Diagnosis not present

## 2020-08-12 DIAGNOSIS — H538 Other visual disturbances: Secondary | ICD-10-CM | POA: Diagnosis not present

## 2020-08-12 DIAGNOSIS — R202 Paresthesia of skin: Secondary | ICD-10-CM | POA: Diagnosis not present

## 2020-08-12 DIAGNOSIS — R42 Dizziness and giddiness: Secondary | ICD-10-CM | POA: Diagnosis not present

## 2020-08-12 NOTE — Progress Notes (Signed)
[facility] [address] [place]. [place] [address] [phone]       OFFICE CONSULT NOTE  [name] Date of Birth:  10-Sep-1970 Medical Record Number:  [id number]   Referring MD: Juleen China  Reason for Referral: Dizziness  HPI: [name] is a 49 year old African-American lady seen today for initial office consultation visit for dizziness.  History is obtained from the patient and reviewed electronic medical records.  She has no significant past medical history except hypertension.  She states she has been having intermittent multifocal symptoms since last 6 months.  She states she gets muscle cramps in both coughs mostly off and on.  There are days when she has pain but has stiffness in the leg Baltazar Apo last for few days.  She has not required any medications for this.  She is also complains of intermittent dizziness by that she means she feels off balance like walking on water.  She denies vertigo,, dysarthria, focal weakness or numbness.  She has no prior history of strokes, TIA, seizures or significant neurological problems.  She complains of numbness and tingling intermittently in the hands and feet but this did not last long.  She denies being under significant stress.  She states at times her vision is blurred but this has not lasted long and she is still able to see.  She has never lost vision.  She denies history of migraine headaches.  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  She denies any prior history of anxiety, depression or being on any psychoactive meds.  She denies any symptoms of neck pain radicular pain Lhermitte's sign, bladder urgency excessive fatigue.  She denies history of any rash, tick bite arthralgias or myalgias.  ROS:   14 system review of systems is positive for dizziness, numbness, muscle cramps, tingling, blurred vision, imbalance all other systems negative PMH:  Past  Medical History:  Diagnosis Date  . Hypertension     Social History:  Social History   Socioeconomic History  . Marital status: Single    Spouse name: Not on file  . Number of children: 0  . Years of education: Not on file  . Highest education level: Some college, no degree  Occupational History  . Occupation: full time  Tobacco Use  . Smoking status: Never Smoker  . Smokeless tobacco: Never Used  Vaping Use  . Vaping Use: Never used  Substance and Sexual Activity  . Alcohol use: No    Alcohol/week: 0.0 standard drinks  . Drug use: No  . Sexual activity: Not Currently    Birth control/protection: None  Other Topics Concern  . Not on file  Social History Narrative   Lives alone   Left Handed   Drinks tea occassionally   Social Determinants of Health   Financial Resource Strain: Not on file  Food Insecurity: Not on file  Transportation Needs: Not on file  Physical Activity: Not on file  Stress: Not on file  Social Connections: Not on file  Intimate Partner Violence: Not on file    Medications:   Current Outpatient Medications on File Prior to Visit  Medication Sig Dispense Refill  . amLODipine (NORVASC) 5 MG tablet Take 1 tablet (5 mg total) by mouth daily. 90 tablet 1   No current facility-administered medications on file prior to visit.    Allergies:   Allergies  Allergen Reactions  . Tetracyclines & Related  Physical Exam General: well developed, well nourished middle-aged African-American lady, seated, in no evident distress Head: head normocephalic and atraumatic.   Neck: supple with no carotid or supraclavicular bruits Cardiovascular: regular rate and rhythm, no murmurs Musculoskeletal: no deformity Skin:  no rash/petichiae Vascular:  Normal pulses all extremities  Neurologic Exam Mental Status: Awake and fully alert. Oriented to place and time. Recent and remote memory intact. Attention span, concentration and fund of knowledge appropriate.  Mood and affect appropriate.  Cranial Nerves: Fundoscopic exam reveals sharp disc margins. Pupils equal, briskly reactive to light. Extraocular movements full without nystagmus. Visual fields full to confrontation. Hearing intact. Facial sensation intact. Face, tongue, palate moves normally and symmetrically.  Motor: Normal bulk and tone. Normal strength in all tested extremity muscles. Sensory.: intact to touch , pinprick , position and vibratory sensation.  Coordination: Rapid alternating movements normal in all extremities. Finger-to-nose and heel-to-shin performed accurately bilaterally. Gait and Station: Arises from chair without difficulty. Stance is normal. Gait demonstrates normal stride length and balance . Able to heel, toe and tandem walk without difficulty.  Reflexes: 2+ brisk and symmetric. Toes downgoing.       ASSESSMENT: 49 year old African-American lady with multifocal symptoms of dizziness, blurred vision, muscle cramps and tingling numbness of unclear etiology.  Neurological exam is pretty much on nonfocal.  Possibilities include demyelinating, autoimmune, inflammatory or infectious disorders.    PLAN: I had a long discussion with the patient regarding her multifocal symptoms of dizziness, blurred vision, leg cramps and tingling numbness and discussed plan for diagnostic evaluation and answered questions.  Recommend we check CPK, LDH, aldolase, ANA panel, anticardiolipin antibodies, ESR, Lyme titer as well as EMG nerve conduction studies and MRI scan of the brain with and without contrast.  She was advised to continue vitamin D replacement.  She will return for follow-up in the future in 2 months or call earlier if necessary.  Greater than 50% time during this 45-minute consultation was it was spent on counseling and coordination of care about her multifocal symptoms and discussion of evaluation treatment plan and answering questions. Antony Contras, MD Note: This document was  prepared with digital dictation and possible smart phrase technology. Any transcriptional errors that result from this process are unintentional.

## 2020-08-12 NOTE — Patient Instructions (Signed)
I had a long discussion with the patient regarding her multifocal symptoms of dizziness, blurred vision, leg cramps and tingling numbness and discussed plan for diagnostic evaluation and answered questions.  Recommend we check CPK, LDH, aldolase, ANA panel, anticardiolipin antibodies, ESR, Lyme titer as well as EMG nerve conduction studies and MRI scan of the brain with and without contrast.  She was advised to continue vitamin D replacement.  She will return for follow-up in the future in 2 months or call earlier if necessary.

## 2020-08-13 ENCOUNTER — Encounter: Payer: Self-pay | Admitting: *Deleted

## 2020-08-13 ENCOUNTER — Telehealth: Payer: Self-pay | Admitting: Emergency Medicine

## 2020-08-13 NOTE — Telephone Encounter (Signed)
-----   Message from Micki Riley, MD sent at 08/13/2020  3:34 PM EST ----- Kindly inform the patient that not all lab work is back yet but blood chemistries and muscle enzymes and sedimentation rate and test for lupus were negative

## 2020-08-13 NOTE — Telephone Encounter (Signed)
Called patient and discussed Dr Marlis Edelson findings for blood work and negative Lupus.  Patient denied any questions and expressed appreciation.

## 2020-08-13 NOTE — Progress Notes (Signed)
Kindly inform patient that Lyme titre is negative

## 2020-08-13 NOTE — Progress Notes (Signed)
Kindly inform the patient that not all lab work is back yet but blood chemistries and muscle enzymes and sedimentation rate and test for lupus were negative

## 2020-08-14 LAB — COMPREHENSIVE METABOLIC PANEL
ALT: 16 IU/L (ref 0–32)
AST: 21 IU/L (ref 0–40)
Albumin/Globulin Ratio: 1.8 (ref 1.2–2.2)
Albumin: 4.9 g/dL — ABNORMAL HIGH (ref 3.8–4.8)
Alkaline Phosphatase: 89 IU/L (ref 44–121)
BUN/Creatinine Ratio: 11 (ref 9–23)
BUN: 11 mg/dL (ref 6–24)
Bilirubin Total: 0.4 mg/dL (ref 0.0–1.2)
CO2: 25 mmol/L (ref 20–29)
Calcium: 10.2 mg/dL (ref 8.7–10.2)
Chloride: 103 mmol/L (ref 96–106)
Creatinine, Ser: 1.03 mg/dL — ABNORMAL HIGH (ref 0.57–1.00)
GFR calc Af Amer: 73 mL/min/{1.73_m2} (ref >59)
GFR calc non Af Amer: 64 mL/min/{1.73_m2} (ref >59)
Globulin, Total: 2.8 g/dL (ref 1.5–4.5)
Glucose: 106 mg/dL — ABNORMAL HIGH (ref 65–99)
Potassium: 4.7 mmol/L (ref 3.5–5.2)
Sodium: 142 mmol/L (ref 134–144)
Total Protein: 7.7 g/dL (ref 6.0–8.5)

## 2020-08-14 LAB — SEDIMENTATION RATE: Sed Rate: 24 mm/hr (ref 0–40)

## 2020-08-14 LAB — CARDIOLIPIN ANTIBODIES, IGG, IGM, IGA
Anticardiolipin IgA: <9 APL U/mL (ref 0–11)
Anticardiolipin IgG: <9 GPL U/mL (ref 0–14)
Anticardiolipin IgM: <9 MPL U/mL (ref 0–12)

## 2020-08-14 LAB — CK: Total CK: 138 U/L (ref 32–182)

## 2020-08-14 LAB — ANA COMPREHENSIVE PANEL
Anti JO-1: <0.2 AI (ref 0.0–0.9)
Centromere Ab Screen: <0.2 AI (ref 0.0–0.9)
Chromatin Ab SerPl-aCnc: <0.2 AI (ref 0.0–0.9)
ENA RNP Ab: 0.2 AI (ref 0.0–0.9)
ENA SM Ab Ser-aCnc: <0.2 AI (ref 0.0–0.9)
ENA SSA (RO) Ab: <0.2 AI (ref 0.0–0.9)
ENA SSB (LA) Ab: <0.2 AI (ref 0.0–0.9)
Scleroderma (Scl-70) (ENA) Antibody, IgG: <0.2 AI (ref 0.0–0.9)
dsDNA Ab: <1 IU/mL (ref 0–9)

## 2020-08-14 LAB — B. BURGDORFI ANTIBODIES: Lyme IgG/IgM Ab: <0.91 {ISR} (ref 0.00–0.90)

## 2020-08-14 LAB — LACTATE DEHYDROGENASE: LDH: 210 IU/L (ref 119–226)

## 2020-08-14 LAB — ALDOLASE: Aldolase: 4.1 U/L (ref 3.3–10.3)

## 2020-08-30 ENCOUNTER — Ambulatory Visit
Admission: RE | Admit: 2020-08-30 | Discharge: 2020-08-30 | Disposition: A | Discharge status: Home / Self Care | Payer: No Typology Code available for payment source | Source: Ambulatory Visit | Attending: Neurology | Admitting: Neurology

## 2020-08-30 ENCOUNTER — Other Ambulatory Visit: Payer: Self-pay

## 2020-08-30 MED ORDER — GADOBENATE DIMEGLUMINE 529 MG/ML IV SOLN
10.0000 mL | Freq: Once | INTRAVENOUS | Status: AC | PRN
  Administered 2020-08-30: 10 mL via INTRAVENOUS

## 2020-09-01 ENCOUNTER — Encounter: Payer: 59 | Admitting: Neurology

## 2020-09-02 NOTE — Progress Notes (Signed)
Kindly inform the patient that MRI scan study of the brain shows no significant abnormality.  No definite cause of dizziness identified.

## 2020-09-03 ENCOUNTER — Telehealth: Payer: Self-pay | Admitting: *Deleted

## 2020-09-03 NOTE — Telephone Encounter (Signed)
-----   Message from Micki Riley, MD sent at 09/02/2020  5:06 PM EST ----- Kindly inform the patient that MRI scan study of the brain shows no significant abnormality.  No definite cause of dizziness identified.

## 2020-09-05 ENCOUNTER — Other Ambulatory Visit: Payer: Self-pay

## 2020-09-08 ENCOUNTER — Ambulatory Visit (INDEPENDENT_AMBULATORY_CARE_PROVIDER_SITE_OTHER): Payer: 59 | Admitting: Internal Medicine

## 2020-09-08 ENCOUNTER — Other Ambulatory Visit: Payer: Self-pay

## 2020-09-08 ENCOUNTER — Encounter: Payer: Self-pay | Admitting: Internal Medicine

## 2020-09-08 VITALS — BP 143/82 | HR 81 | Temp 98.8°F | Resp 18 | Ht 61.0 in | Wt 106.4 lb

## 2020-09-08 DIAGNOSIS — I10 Essential (primary) hypertension: Secondary | ICD-10-CM | POA: Diagnosis not present

## 2020-09-08 DIAGNOSIS — E785 Hyperlipidemia, unspecified: Secondary | ICD-10-CM

## 2020-09-08 MED ORDER — AMLODIPINE BESYLATE 10 MG PO TABS: 10.0000 mg | ORAL_TABLET | Freq: Every day | ORAL | 1 refills | Status: DC

## 2020-09-08 NOTE — Progress Notes (Signed)
Script faxed to pharmacy due to e-scribing error  

## 2020-09-08 NOTE — Progress Notes (Signed)
Generalized pain, dizziness- constant every day

## 2020-09-08 NOTE — Progress Notes (Signed)
  Subjective:    Laura Estes - 50 y.o. female MRN [id number]  Date of birth: 10-20-1970  HPI  Laura Estes is here for f/u HTN.    Chronic HTN Disease Monitoring:  Home BP Monitoring - Monitors at home. Believes typically 140/80s but does not have log.  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 Total cholesterol 296. LDL 155. HDL 126. ASCVD risk score of 3.4%.    Health Maintenance:  Health Maintenance Due  Topic Date Due  . Hepatitis C Screening  Never done  . COLONOSCOPY (Pts 45-36yrs Insurance coverage will need to be confirmed)  Never done  . PAP SMEAR-Modifier  10/25/2020    -  reports that she has never smoked. She has never used smokeless tobacco. - Review of Systems: Per HPI. - Past Medical History: Patient Active Problem List   Diagnosis Date Noted  . Perimenopausal 09/26/2014  . Dyspepsia 06/24/2014  . Elevated BP 06/24/2014   - Medications: reviewed and updated   Objective:   Physical Exam BP (!) 143/82 (BP Location: Right Arm, Patient Position: Sitting, Cuff Size: Normal)   Pulse 81   Temp 98.8 F (37.1 C) (Oral)   Resp 18   Ht 5\' 1"  (1.549 m)   Wt 106 lb 6.4 oz (48.3 kg)   LMP 11/15/2013   SpO2 99%   BMI 20.10 kg/m  Physical Exam         Assessment & Plan:   1. Essential hypertension BP slightly above goal. Increase Amlodipine from 5 to 10 mg.  Counseled on blood pressure goal of less than 130/80, low-sodium, DASH diet, medication compliance, 150 minutes of moderate intensity exercise per week. Discussed medication compliance, adverse effects. - amLODipine (NORVASC) 10 MG tablet; Take 1 tablet (10 mg total) by mouth daily.  Dispense: 90 tablet; Refill: 1  2. Hyperlipidemia, unspecified hyperlipidemia type Patient has mixed HLD. Calculated ASCVD  risk score and quite low at 3.4%. Discussed this with patient. Therefore, would not recommend initiating statin. Discussed genetic component of HLD. Would recommend aiming for 150 minutes of cardiac exercise per week. Advise to decrease animal fat intake, saturated fats, processed foods. Increase fiber rich foods such as fruits and veggies.    01/15/2014, D.O. 09/08/2020, 9:17 AM [facility]

## 2020-09-08 NOTE — Addendum Note (Signed)
Addended by: Eloise Levels on: 09/08/2020 12:10 PM   Modules accepted: Orders

## 2020-09-10 ENCOUNTER — Encounter (INDEPENDENT_AMBULATORY_CARE_PROVIDER_SITE_OTHER): Payer: 59 | Admitting: Neurology

## 2020-09-10 ENCOUNTER — Ambulatory Visit (INDEPENDENT_AMBULATORY_CARE_PROVIDER_SITE_OTHER): Payer: 59 | Admitting: Neurology

## 2020-09-10 DIAGNOSIS — R202 Paresthesia of skin: Secondary | ICD-10-CM

## 2020-09-10 DIAGNOSIS — Z0289 Encounter for other administrative examinations: Secondary | ICD-10-CM

## 2020-09-10 NOTE — Procedures (Signed)
Full Name: Laura Estes Gender: Female MRN #: [id number] Date of Birth: [birth year]    Visit Date: 09/10/2020 07:30 Age: 50 Years Examining Physician: Levert Feinstein, MD  Referring Physician: Delia Heady, MD History: 50 years old female complains of diffuse body achy pain  Summary of the test: Nerve conduction study: Right sural, superficial peroneal, median, ulnar sensory responses were normal Right tibial, peroneal to EDB, ulnar, median motor responses were normal  Electromyography: Selected needle examination of right upper, lower extremity muscles, right cervical and lumbosacral paraspinal muscles were normal.  Conclusion: This is a normal study. There is no electrodiagnostic evidence of intrinsic muscle disease, right cervical, or lumbar radiculopathy.    ------------------------------- Levert Feinstein, M.D. PhD  [facility] [address] [place], [place] [address] Tel: [phone] Fax: [phone]  Verbal informed consent was obtained from the patient, patient was informed of potential risk of procedure, including bruising, bleeding, hematoma formation, infection, muscle weakness, muscle pain, numbness, among others.        MNC    Nerve / Sites Muscle Latency Ref. Amplitude Ref. Rel Amp Segments Distance Velocity Ref. Area    ms ms mV mV %  cm m/s m/s mVms  R Median - APB     Wrist APB 3.5 ?4.4 6.9 ?4.0 100 Wrist - APB 7   24.0     Upper arm APB 7.0  11.0  159 Upper arm - Wrist 21 60 ?49 38.8     Ulnar Wrist APB 3.2  4.4  40.2 Ulnar Wrist - APB    14.1     Ulnar B. Elbow APB 6.7  4.0  90.1 Ulnar B. Elbow - APB    13.3     Ulnar A. Elbow APB 8.1  4.0  100 Ulnar A. Elbow - Ulnar B. Elbow    13.3  R Ulnar - ADM     Wrist ADM 3.0 ?3.3 10.4 ?6.0 100 Wrist - ADM 7   40.5     B.Elbow ADM 6.0  10.2  97.9 B.Elbow - Wrist 18 60 ?49 41.3     A.Elbow ADM 7.6  9.9  97.3 A.Elbow - B.Elbow 10 63 ?49 40.7     Median Wrist ADM 2.9  0.2  2.13 Median  Wrist - ADM    0.9     Median Elbow ADM 6.8  0.5  246 Median Elbow - ADM    4.2  R Peroneal - EDB     Ankle EDB 4.9 ?6.5 6.7 ?2.0 100 Ankle - EDB 9   20.4     Fib head EDB 10.0  6.4  95.7 Fib head - Ankle 25 49 ?44 20.4     Pop fossa EDB 12.1  6.3  98.5 Pop fossa - Fib head 10 48 ?44 20.2         Pop fossa - Ankle      R Tibial - AH     Ankle AH 3.9 ?5.8 18.7 ?4.0 100 Ankle - AH 9   36.6     Pop fossa AH 12.1  14.4  76.9 Pop fossa - Ankle 36 44 ?41 31.1             SNC    Nerve / Sites Rec. Site Peak Lat Ref.  Amp Ref. Segments Distance Peak Diff Ref.    ms ms V V  cm ms ms  R Sural - Ankle (Calf)     Calf Ankle  3.8 ?4.4 39 ?6 Calf - Ankle 14    R Superficial peroneal - Ankle     Lat leg Ankle 3.9 ?4.4 15 ?6 Lat leg - Ankle 14    R Median, Ulnar - Transcarpal comparison     Median Palm Wrist 2.1 ?2.2 94 ?35 Median Palm - Wrist 8       Ulnar Palm Wrist 2.3 ?2.2 84 ?12 Ulnar Palm - Wrist 8          Median Palm - Ulnar Palm  -0.2 ?0.4  R Median - Orthodromic (Dig II, Mid palm)     Dig II Wrist 3.1 ?3.4 21 ?10 Dig II - Wrist 13    R Ulnar - Orthodromic, (Dig V, Mid palm)     Dig V Wrist 2.9 ?3.1 19 ?5 Dig V - Wrist 66                 F  Wave    Nerve F Lat Ref.   ms ms  R Tibial - AH 47.0 ?56.0  R Ulnar - ADM 25.2 ?32.0         EMG Summary Table    Spontaneous MUAP Recruitment  Muscle IA Fib PSW Fasc Other Amp Dur. Poly Pattern  R. Tibialis anterior Normal None None None _______ Normal Normal Normal Normal  R. Tibialis posterior Normal None None None _______ Normal Normal Normal Normal  R. Peroneus longus Normal None None None _______ Normal Normal Normal Normal  R. Gastrocnemius (Medial head) Normal None None None _______ Normal Normal Normal Normal  R. Vastus lateralis Normal None None None _______ Normal Normal Normal Normal  R. Lumbar paraspinals (low) Normal None None None _______ Normal Normal Normal Normal  R. Lumbar paraspinals (mid) Normal None None None _______  Normal Normal Normal Normal  R. First dorsal interosseous Normal None None None _______ Normal Normal Normal Normal  R. Pronator teres Normal None None None _______ Normal Normal Normal Normal  R. Deltoid Normal None None None _______ Normal Normal Normal Normal  R. Biceps brachii Normal None None None _______ Normal Normal Normal Normal  R. Cervical paraspinals Normal None None None _______ Normal Normal Normal Normal

## 2020-09-17 NOTE — Progress Notes (Signed)
Kindly inform the patient that EMG nerve conduction study was normal without evidence of any pinched nerve or nerve damage

## 2020-09-18 ENCOUNTER — Telehealth: Payer: Self-pay | Admitting: Emergency Medicine

## 2020-09-18 NOTE — Telephone Encounter (Signed)
Called and spoke to [facility] regarding her EMG nerve conduction study results.  Patient denied further questions, verbalized understanding and expressed appreciation for the phone call.

## 2020-09-18 NOTE — Telephone Encounter (Signed)
-----   Message from Micki Riley, MD sent at 09/17/2020  3:47 PM EST ----- Kindly inform the patient that EMG nerve conduction study was normal without evidence of any pinched nerve or nerve damage

## 2020-10-22 ENCOUNTER — Ambulatory Visit (INDEPENDENT_AMBULATORY_CARE_PROVIDER_SITE_OTHER): Payer: 59 | Admitting: Neurology

## 2020-10-22 ENCOUNTER — Encounter: Payer: Self-pay | Admitting: Neurology

## 2020-10-22 VITALS — BP 116/77 | HR 91 | Ht 61.0 in | Wt 103.4 lb

## 2020-10-22 DIAGNOSIS — H538 Other visual disturbances: Secondary | ICD-10-CM

## 2020-10-22 DIAGNOSIS — R42 Dizziness and giddiness: Secondary | ICD-10-CM

## 2020-10-22 NOTE — Progress Notes (Signed)
[facility] [address] [place] [address] [phone]       OFFICE FOLLOW UP VISIT NOTE  [name] Date of Birth:  1971/05/12 Medical Record Number:  [id number]   Referring MD: Juleen China  Reason for Referral: Dizziness  HPI: Initial visit 08/12/2020 Laura Estes is a 50 year old African-American lady seen today for initial office consultation visit for dizziness.  History is obtained from the patient and reviewed electronic medical records.  She has no significant past medical history except hypertension.  She states she has been having intermittent multifocal symptoms since last 6 months.  She states she gets muscle cramps in both coughs mostly off and on.  There are days when she has pain but has stiffness in the leg Baltazar Apo last for few days.  She has not required any medications for this.  She is also complains of intermittent dizziness by that she means she feels off balance like walking on water.  She denies vertigo,, dysarthria, focal weakness or numbness.  She has no prior history of strokes, TIA, seizures or significant neurological problems.  She complains of numbness and tingling intermittently in the hands and feet but this did not last long.  She denies being under significant stress.  She states at times her vision is blurred but this has not lasted long and she is still able to see.  She has never lost vision.  She denies history of migraine headaches.  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  She denies any prior history of anxiety, depression or being on any psychoactive meds.  She denies any symptoms of neck pain radicular pain Lhermitte's sign, bladder urgency excessive fatigue.  She denies history of any rash, tick bite arthralgias or myalgias. Update 10/22/2020: She returns for follow-up after last visit 2 months ago.  She continues to have multifocal symptoms of dizziness, muscle  aches and pains, cramps, blurred vision and recently she started having some abdominal pain as well and plans to see primary care physician to seek referral to gastroenterologist.  She underwent lab work at last visit all of which came back unremarkable including CMP, aldolase, LDH, creatinine kinase, anticardiolipin antibodies, ESR, ANA, Lyme titer, vitamin D and B12 levels.  She also had EMG nerve conduction study done on 09/10/2020 by Dr. Evelena Leyden which was normal.  MRI scan of the brain with and without contrast done on 08/30/2020 was also normal.  Patient seems frustrated with lack of definitive diagnosis and I advised her to see primary care physician for further referrals if needed. ROS:   14 system review of systems is positive for dizziness, numbness, muscle cramps, tingling, blurred vision, imbalance all other systems negative PMH:  Past Medical History:  Diagnosis Date  . Hypertension     Social History:  Social History   Socioeconomic History  . Marital status: Single    Spouse name: Not on file  . Number of children: 0  . Years of education: Not on file  . Highest education level: Some college, no degree  Occupational History  . Occupation: full time  Tobacco Use  . Smoking status: Never Smoker  . Smokeless tobacco: Never Used  Vaping Use  . Vaping Use: Never used  Substance and Sexual Activity  . Alcohol use: No    Alcohol/week: 0.0 standard drinks  . Drug use: No  . Sexual activity: Not Currently    Birth control/protection: None  Other Topics Concern  . Not on file  Social History Narrative   Lives alone   Left Handed   Drinks tea occassionally   Social Determinants of Health   Financial Resource Strain: Not on file  Food Insecurity: Not on file  Transportation Needs: Not on file  Physical Activity: Not on file  Stress: Not on file  Social Connections: Not on file  Intimate Partner Violence: Not on file    Medications:   Current Outpatient Medications on  File Prior to Visit  Medication Sig Dispense Refill  . amLODipine (NORVASC) 10 MG tablet Take 1 tablet (10 mg total) by mouth daily. 90 tablet 1   No current facility-administered medications on file prior to visit.    Allergies:   Allergies  Allergen Reactions  . Tetracyclines & Related     Physical Exam General: well developed, well nourished middle-aged African-American lady, seated, in no evident distress Head: head normocephalic and atraumatic.   Neck: supple with no carotid or supraclavicular bruits Cardiovascular: regular rate and rhythm, no murmurs Musculoskeletal: no deformity Skin:  no rash/petichiae Vascular:  Normal pulses all extremities  Neurologic Exam Mental Status: Awake and fully alert. Oriented to place and time. Recent and remote memory intact. Attention span, concentration and fund of knowledge appropriate. Mood and affect appropriate.  Cranial Nerves: Fundoscopic exam not done. Pupils equal, briskly reactive to light. Extraocular movements full without nystagmus. Visual fields full to confrontation. Hearing intact. Facial sensation intact. Face, tongue, palate moves normally and symmetrically.  Motor: Normal bulk and tone. Normal strength in all tested extremity muscles. Sensory.: intact to touch , pinprick , position and vibratory sensation.  Coordination: Rapid alternating movements normal in all extremities. Finger-to-nose and heel-to-shin performed accurately bilaterally. Gait and Station: Arises from chair without difficulty. Stance is normal. Gait demonstrates normal stride length and balance . Able to heel, toe and tandem walk without difficulty.  Reflexes: 2+ brisk and symmetric. Toes downgoing.       ASSESSMENT: 50 year old African-American lady with multifocal symptoms of dizziness, blurred vision, muscle cramps and tingling numbness of unclear etiology.  Neurological exam is pretty much on nonfocal.  Lab work-up, neuroimaging and neurophysiological  work-up for demyelinating, autoimmune, inflammatory or infectious disorders has been negative.  Is unlikely to be a primary neurological illness.    PLAN: I had a long discussion with the patient regarding her multifocal symptoms which versus and discussed results of brain MRI scan, EMG nerve conduction study and lab work all of which were unremarkable.  I do not have a definite neurological explanation for her symptoms.  I recommend she continue follow-up with the primary care physician and seek evaluation with a gastroenterologist for abdominal complaints and ophthalmologist for her blurred vision.  She may return for follow-up with me in the future only as necessary and no schedule appointment was made. Greater than 50% time during this 25-minute visit was spent on counseling and coordination of care about her multifocal symptoms and discussion of evaluation treatment plan and answering questions. Antony Contras, MD Note: This document was prepared with digital dictation and possible smart phrase technology. Any transcriptional errors that result from this process are unintentional.

## 2020-10-22 NOTE — Patient Instructions (Signed)
I had a long discussion with the patient regarding her multifocal symptoms which versus and discussed results of brain MRI scan, EMG nerve conduction study and lab work all of which were unremarkable.  I do not have a definite neurological explanation for her symptoms.  I recommend she continue follow-up with the primary care physician and seek evaluation with a gastroenterologist for abdominal complaints and ophthalmologist for her blurred vision.  She may return for follow-up with me in the future only as necessary and no schedule appointment was made.

## 2020-10-31 ENCOUNTER — Other Ambulatory Visit: Payer: Self-pay

## 2020-10-31 ENCOUNTER — Ambulatory Visit (INDEPENDENT_AMBULATORY_CARE_PROVIDER_SITE_OTHER): Payer: 59 | Admitting: Internal Medicine

## 2020-10-31 ENCOUNTER — Encounter: Payer: Self-pay | Admitting: Internal Medicine

## 2020-10-31 ENCOUNTER — Other Ambulatory Visit (HOSPITAL_COMMUNITY)
Admission: RE | Admit: 2020-10-31 | Discharge: 2020-10-31 | Disposition: A | Discharge status: Home / Self Care | Payer: 59 | Source: Ambulatory Visit | Attending: Internal Medicine | Admitting: Internal Medicine

## 2020-10-31 VITALS — BP 117/78 | HR 83 | Temp 97.5°F | Resp 16 | Wt 101.0 lb

## 2020-10-31 DIAGNOSIS — Z124 Encounter for screening for malignant neoplasm of cervix: Secondary | ICD-10-CM

## 2020-10-31 DIAGNOSIS — R87610 Atypical squamous cells of undetermined significance on cytologic smear of cervix (ASC-US): Secondary | ICD-10-CM | POA: Diagnosis not present

## 2020-10-31 DIAGNOSIS — Z1211 Encounter for screening for malignant neoplasm of colon: Secondary | ICD-10-CM

## 2020-10-31 NOTE — Progress Notes (Signed)
  Subjective:    Laura Estes - 50 y.o. female MRN [id number]  Date of birth: 1971-03-13  HPI  Laura Estes is here for repeat PAP. She had PAP 10/26/19 that showed ASCUS with negative HPV.     Health Maintenance:  Health Maintenance Due  Topic Date Due  . Hepatitis C Screening  Never done  . COVID-19 Vaccine (1) Never done  . COLONOSCOPY (Pts 45-12yrs Insurance coverage will need to be confirmed)  Never done  . PAP SMEAR-Modifier  10/25/2020    -  reports that she has never smoked. She has never used smokeless tobacco. - Review of Systems: Per HPI. - Past Medical History: Patient Active Problem List   Diagnosis Date Noted  . Essential hypertension 09/08/2020  . Hyperlipidemia 09/08/2020  . Perimenopausal 09/26/2014  . Dyspepsia 06/24/2014  . Elevated BP 06/24/2014   - Medications: reviewed and updated   Objective:   Physical Exam BP 117/78   Pulse 83   Temp (!) 97.5 F (36.4 C)   Resp 16   Wt 101 lb (45.8 kg)   LMP 11/15/2013   SpO2 99%   BMI 19.08 kg/m  Physical Exam Constitutional:      General: She is not in acute distress.    Appearance: She is not diaphoretic.  Cardiovascular:     Rate and Rhythm: Normal rate.  Pulmonary:     Effort: Pulmonary effort is normal. No respiratory distress.  Genitourinary:    Comments: GU/GYN: Exam performed in the presence of a chaperone. External genitalia within normal limits.  Vaginal mucosa pink, moist, normal rugae.  Nonfriable cervix without lesions, no discharge or bleeding noted on speculum exam.   Musculoskeletal:        General: Normal range of motion.  Skin:    General: Skin is warm and dry.  Neurological:     Mental Status: She is alert and oriented to person, place, and time.  Psychiatric:        Mood and Affect: Affect normal.        Judgment: Judgment normal.            Assessment & Plan:   1. Screening for cervical cancer - Cytology - PAP([facility])  2. ASCUS of cervix with negative high  risk HPV ASCUS with negative HPV one year ago. Repeat PAP today.  - Cytology - PAP([facility])  3. Screening for colon cancer - Ambulatory referral to Gastroenterology   Marcy Siren, D.O. 10/31/2020, 9:21 AM Primary Care at [facility]

## 2020-11-03 ENCOUNTER — Encounter: Payer: Self-pay | Admitting: Gastroenterology

## 2020-11-04 LAB — CYTOLOGY - PAP
Comment: NEGATIVE
Diagnosis: NEGATIVE
High risk HPV: NEGATIVE

## 2020-11-10 ENCOUNTER — Telehealth: Payer: Self-pay | Admitting: *Deleted

## 2020-11-10 NOTE — Telephone Encounter (Signed)
-----   Message from Arvilla Market, DO sent at 11/04/2020  4:54 PM EDT ----- Please call Victory Dakin about PAP results: Negative for malignancy/lesions/HPV. Repeat PAP in 3-5 years.   .   Thanks,  Marcy Siren, D.O. Primary Care at [facility]  11/04/2020, 4:53 PM

## 2020-11-10 NOTE — Telephone Encounter (Signed)
Medical Assistant left message on patient's home and cell voicemail. Voicemail states to give a call back to [place] with PCE [phone]. Per patients signed DPR patient is aware of PAP being normal and having a repeat completed in 3 to 5 years.

## 2020-11-19 ENCOUNTER — Ambulatory Visit: Payer: 59 | Admitting: Gastroenterology

## 2020-12-10 ENCOUNTER — Encounter: Payer: Self-pay | Admitting: Gastroenterology

## 2020-12-10 ENCOUNTER — Ambulatory Visit (INDEPENDENT_AMBULATORY_CARE_PROVIDER_SITE_OTHER): Payer: 59 | Admitting: Gastroenterology

## 2020-12-10 VITALS — BP 124/66 | HR 65 | Ht 61.0 in | Wt 100.4 lb

## 2020-12-10 DIAGNOSIS — R140 Abdominal distension (gaseous): Secondary | ICD-10-CM | POA: Diagnosis not present

## 2020-12-10 DIAGNOSIS — K5900 Constipation, unspecified: Secondary | ICD-10-CM

## 2020-12-10 DIAGNOSIS — R109 Unspecified abdominal pain: Secondary | ICD-10-CM | POA: Diagnosis not present

## 2020-12-10 NOTE — Patient Instructions (Signed)
If you are age 51 or younger, your body mass index should be between 19-25. Your Body mass index is 18.97 kg/m. If this is out of the aformentioned range listed, please consider follow up with your Primary Care Provider.   It has been recommended to you by your physician that you have a colonoscopy completed. Per your request, we did not schedule the procedure(s) today. Please contact our office at [phone] should you decide to have the procedure completed. You will be scheduled for a pre-visit and procedure at that time.  Please start taking citrucel (orange flavored) powder fiber supplement.  This may cause some bloating at first but that usually goes away. Begin with a small spoonful and work your way up to a large, heaping spoonful daily over a week.  Thank you for entrusting me with your care and choosing [facility].  Dr Christella Hartigan

## 2020-12-10 NOTE — Progress Notes (Signed)
HPI: This is a very pleasant 50 year old woman who was referred to me by Leary Roca*  to evaluate constipation, abdominal discomforts, bloating.    For least 2 to 3 years she has had bloating, constipation and generalized abdominal discomforts.  She tells me that unless she drinks Mylanta she can sometimes have no BM for 2 weeks at a time.  More regularly she will go every 2 to 3 days or so with a lot of pushing and straining.  She has tried MiraLAX in the past and that did not help.  She has tried fiber supplements in the past with some good relief.  She does not see blood in her stool, her weight is stable, colon cancer does not run in her family.  Old Data Reviewed: Blood work December 2021 shows essentially normal complete metabolic profile, normal sed rate,  Review of systems: Pertinent positive and negative review of systems were noted in the above HPI section. All other review negative.   Past Medical History:  Diagnosis Date  . Hypertension     History reviewed. No pertinent surgical history.  Current Outpatient Medications  Medication Sig Dispense Refill  . amLODipine (NORVASC) 10 MG tablet Take 1 tablet (10 mg total) by mouth daily. 90 tablet 1   No current facility-administered medications for this visit.    Allergies as of 12/10/2020 - Review Complete 12/10/2020  Allergen Reaction Noted  . Tetracyclines & related  11/30/2016    Family History  Problem Relation Age of Onset  . Lung cancer Mother   . Hypertension Father   . Heart disease Father   . Cancer Maternal Grandmother        ovarian and uterine  . Breast cancer Maternal Aunt   . Breast cancer Cousin     Social History   Socioeconomic History  . Marital status: Single    Spouse name: Not on file  . Number of children: 0  . Years of education: Not on file  . Highest education level: Some college, no degree  Occupational History  . Occupation: full time  Tobacco Use  . Smoking status:  Never Smoker  . Smokeless tobacco: Never Used  Vaping Use  . Vaping Use: Never used  Substance and Sexual Activity  . Alcohol use: No    Alcohol/week: 0.0 standard drinks  . Drug use: No  . Sexual activity: Not Currently    Birth control/protection: None  Other Topics Concern  . Not on file  Social History Narrative   Lives alone   Left Handed   Drinks tea occassionally   Social Determinants of Health   Financial Resource Strain: Not on file  Food Insecurity: Not on file  Transportation Needs: Not on file  Physical Activity: Not on file  Stress: Not on file  Social Connections: Not on file  Intimate Partner Violence: Not on file     Physical Exam: BP 124/66   Pulse 65   Ht 5\' 1"  (1.549 m)   Wt 100 lb 6.4 oz (45.5 kg)   LMP 11/15/2013   BMI 18.97 kg/m  Constitutional: generally well-appearing Psychiatric: alert and oriented x3 Eyes: extraocular movements intact Mouth: oral pharynx moist, no lesions Neck: supple no lymphadenopathy Cardiovascular: heart regular rate and rhythm Lungs: clear to auscultation bilaterally Abdomen: soft, nontender, nondistended, no obvious ascites, no peritoneal signs, normal bowel sounds Extremities: no lower extremity edema bilaterally Skin: no lesions on visible extremities   Assessment and plan: 50 y.o. female with chronic constipation  I think it is very likely that her chronic mild constipation is the cause of her bloating and her generalized abdominal discomfort.  She will sometimes not have a bowel movement for 2 weeks.  Mylanta seems to somewhat help but is certainly lower on my list of agents to help with chronic constipation.  I recommended she start taking Citrucel fiber powder supplement on a daily basis, slowly titrating up ordering a large glass of water to keep her hydrated.  I also recommended colonoscopy at her soonest convenience to exclude other potential causes of constipation such as significant stricturing, growths  like neoplasm which I think is unlikely.  She understands that if the fiber supplement is not helpful for her constipation then there are other agents that we would try.  Please see the "Patient Instructions" section for addition details about the plan.   Rob Bunting, MD [facility] 12/10/2020, 10:11 AM  Cc: Leary Roca*   Total time on date of encounter was (this included time spent preparing to see the patient reviewing records; obtaining and/or reviewing separately obtained history; performing a medically appropriate exam and/or evaluation; counseling and educating the patient and family if present; ordering medications, tests or procedures if applicable; and documenting clinical information in the health record).

## 2021-03-09 ENCOUNTER — Other Ambulatory Visit: Payer: Self-pay | Admitting: Internal Medicine

## 2021-03-09 ENCOUNTER — Telehealth: Payer: Self-pay | Admitting: Internal Medicine

## 2021-03-09 DIAGNOSIS — Z1231 Encounter for screening mammogram for malignant neoplasm of breast: Secondary | ICD-10-CM

## 2021-03-09 NOTE — Telephone Encounter (Signed)
Med refill request   amLODipine (NORVASC) 10 MG tablet [[id number]]  Pharmacy  [facility] [address] [place] - [address]  [address], [place] [address]  Phone:  [phone]  Fax:  [phone]

## 2021-03-11 NOTE — Telephone Encounter (Signed)
Last visit for hypertension was on 09/08/2020 with Marcy Siren, DO. Please schedule in-person office visit for medication refills.

## 2021-03-11 NOTE — Telephone Encounter (Signed)
1) Medication(s) Requested (by name):   amLODipine (NORVASC) 10 MG tablet [[id number]]  2) Pharmacy of Choice:  [facility] [address], [place] - [address]  [address], [place] [address]  Phone:  [phone]  Fax:  [phone]  3) Special Requests:   Approved medications will be sent to the pharmacy, we will reach out if there is an issue.  Requests made after 3pm may not be addressed until the following business day!  If a patient is unsure of the name of the medication(s) please note and ask patient to call back when they are able to provide all info, do not send to responsible party until all information is available!

## 2021-03-20 ENCOUNTER — Other Ambulatory Visit: Payer: Self-pay

## 2021-03-20 ENCOUNTER — Encounter: Payer: Self-pay | Admitting: Family

## 2021-03-20 ENCOUNTER — Ambulatory Visit (INDEPENDENT_AMBULATORY_CARE_PROVIDER_SITE_OTHER): Payer: 59 | Admitting: Family

## 2021-03-20 VITALS — BP 133/90 | HR 82 | Temp 97.5°F | Resp 16 | Ht 61.0 in | Wt 97.6 lb

## 2021-03-20 DIAGNOSIS — I10 Essential (primary) hypertension: Secondary | ICD-10-CM

## 2021-03-20 MED ORDER — AMLODIPINE BESYLATE 10 MG PO TABS: 10.0000 mg | ORAL_TABLET | Freq: Every day | ORAL | 1 refills | Status: DC

## 2021-03-20 NOTE — Progress Notes (Signed)
   Laura Estes, is a 50 y.o. female  ZOX:[id number]  [id number]  DOB - [birth year]  Subjective:  Chief Complaint and HPI: Laura Estes is a 50 y.o. female who presented to the clinic this morning for blood pressure medication refill.  Has a history of hypertension, been out of her amlodipine for 1 to 2 weeks.  Now has been experiencing headache since being out of this medication.  This chest pain, shortness of breath,, chills.  Endorses having blurry vision, last eye exam has been more than a year ago.  ED/Hospital notes reviewed.    ROS:   Constitutional:  No f/c, No night sweats, No unexplained weight loss. EENT: Ports intermittent blurry vision many years, No hearing changes. No mouth, throat, or ear problems.  Respiratory: No cough, No SOB Cardiac: No CP, no palpitations GI:  No abd pain, No N/V/D. Musculoskeletal: No joint pain Neuro: No headache, no dizziness, no motor weakness.  Endocrine:  No polydipsia. No polyuria.  Psych: Denies SI/HI  No problems updated.  ALLERGIES: Allergies  Allergen Reactions   Tetracyclines & Related     PAST MEDICAL HISTORY: Past Medical History:  Diagnosis Date   Hypertension     MEDICATIONS AT HOME: Prior to Admission medications   Medication Sig Start Date End Date Taking? Authorizing Provider  amLODipine (NORVASC) 10 MG tablet Take 1 tablet (10 mg total) by mouth daily. 03/20/21   Eleonore Chiquito, FNP     Objective:  EXAM:   Vitals:   03/20/21 1106  BP: 133/90  Pulse: 82  Resp: 16  Temp: (!) 97.5 F (36.4 C)  SpO2: 99%  Weight: 97 lb 9.6 oz (44.3 kg)  Height: 5\' 1"  (1.549 m)    General appearance : A&OX3. NAD. Non-toxic-appearing HEENT: Atraumatic and Normocephalic.  PERRLA. EOM intact.  TM clear B. Mouth-MMM, post pharynx WNL w/o erythema, No PND. Neck: supple, no JVD. No cervical lymphadenopathy. No thyromegaly Chest/Lungs:  Breathing-non-labored, Good air entry bilaterally, breath sounds normal without  rales, rhonchi, or wheezing  CVS: S1 S2 regular, no murmurs, gallops, rubs  Abdomen: Bowel sounds present, Non tender and not distended with no gaurding, rigidity or rebound. Neurology:  CN II-XII grossly intact, Non focal.   Psych:  TP linear. J/I WNL. Normal speech. Appropriate eye contact and affect.    Data Review Lab Results  Component Value Date   HGBA1C 5.5 08/29/2018   HGBA1C 5.8 (H) 06/24/2014     Assessment & Plan   1. Essential hypertension -Take all your medications as prescribed - amLODipine (NORVASC) 10 MG tablet; Take 1 tablet (10 mg total) by mouth daily.  Dispense: 90 tablet; Refill: 1     Patient have been counseled extensively about nutrition and exercise  Return in about 3 months (around 06/20/2021) for pcp for routine visit with labs.  The patient was given clear instructions to go to ER or return to medical center if symptoms don't improve, worsen or new problems develop. The patient verbalized understanding. The patient was told to call to get lab results if they haven't heard anything in the next week.     13/12/2020, APRN, FNP-C [facility] [place], [place]   03/20/2021, 11:24 AM

## 2021-03-20 NOTE — Progress Notes (Signed)
Medication refill  Out of medication last week

## 2021-03-20 NOTE — Patient Instructions (Signed)
Take medications as prescribed Call or come back with new symptoms or concerns about your health Schedule a 3 month follow up for routine visit. Get your eyes examined.

## 2021-03-23 ENCOUNTER — Telehealth: Payer: Self-pay | Admitting: Internal Medicine

## 2021-03-23 ENCOUNTER — Other Ambulatory Visit: Payer: Self-pay

## 2021-03-23 DIAGNOSIS — I10 Essential (primary) hypertension: Secondary | ICD-10-CM

## 2021-03-23 MED ORDER — AMLODIPINE BESYLATE 10 MG PO TABS: 10.0000 mg | ORAL_TABLET | Freq: Every day | ORAL | 1 refills | Status: DC

## 2021-03-23 NOTE — Telephone Encounter (Signed)
Rx has been sent  

## 2021-03-23 NOTE — Telephone Encounter (Signed)
Patient called in stating she was seen 03/20/2021 so she could get her medication refilled. Patient stated it still has not been called in. Patient is asking that her medication be called in.  amLODipine (NORVASC) 10 MG tablet  [facility]  Address: [address] [place], [place], [place] [address] Phone: [phone]

## 2021-04-30 ENCOUNTER — Ambulatory Visit
Admission: RE | Admit: 2021-04-30 | Discharge: 2021-04-30 | Disposition: A | Discharge status: Home / Self Care | Payer: 59 | Source: Ambulatory Visit | Attending: Internal Medicine | Admitting: Internal Medicine

## 2021-04-30 ENCOUNTER — Other Ambulatory Visit: Payer: Self-pay

## 2021-04-30 DIAGNOSIS — Z1231 Encounter for screening mammogram for malignant neoplasm of breast: Secondary | ICD-10-CM

## 2021-07-24 ENCOUNTER — Encounter: Payer: Self-pay | Admitting: Family Medicine

## 2021-07-24 ENCOUNTER — Other Ambulatory Visit: Payer: Self-pay

## 2021-07-24 ENCOUNTER — Ambulatory Visit (INDEPENDENT_AMBULATORY_CARE_PROVIDER_SITE_OTHER): Payer: 59 | Admitting: Family Medicine

## 2021-07-24 VITALS — BP 135/83 | HR 97 | Temp 98.3°F | Resp 16 | Ht 61.0 in | Wt 95.8 lb

## 2021-07-24 DIAGNOSIS — I10 Essential (primary) hypertension: Secondary | ICD-10-CM | POA: Diagnosis not present

## 2021-07-24 DIAGNOSIS — E785 Hyperlipidemia, unspecified: Secondary | ICD-10-CM

## 2021-07-24 DIAGNOSIS — Z7689 Persons encountering health services in other specified circumstances: Secondary | ICD-10-CM

## 2021-07-24 MED ORDER — ATORVASTATIN CALCIUM 20 MG PO TABS: 20.0000 mg | ORAL_TABLET | Freq: Every day | ORAL | 1 refills | Status: DC

## 2021-07-24 NOTE — Progress Notes (Signed)
Patient is here for follow-up on lab work. Patient is concern about her lab numbers

## 2021-07-28 NOTE — Progress Notes (Signed)
Established Patient Office Visit  Subjective:  Patient ID: Laura Estes, female    DOB: 26-Apr-1971  Age: 50 y.o. MRN: [id number]  CC:  Chief Complaint  Patient presents with   Follow-up    Lab work    HPI Laura Estes presents for follow up of recent lab work. This labwork was taken by an independent group that does annual wellness checks on their insured. She denies acute complaints.   Past Medical History:  Diagnosis Date   Hypertension     History reviewed. No pertinent surgical history.  Family History  Problem Relation Age of Onset   Lung cancer Mother    Hypertension Father    Heart disease Father    Cancer Maternal Grandmother        ovarian and uterine   Breast cancer Maternal Aunt    Breast cancer Cousin     Social History   Socioeconomic History   Marital status: Single    Spouse name: Not on file   Number of children: 0   Years of education: Not on file   Highest education level: Some college, no degree  Occupational History   Occupation: full time  Tobacco Use   Smoking status: Never   Smokeless tobacco: Never  Vaping Use   Vaping Use: Never used  Substance and Sexual Activity   Alcohol use: No    Alcohol/week: 0.0 standard drinks   Drug use: No   Sexual activity: Not Currently    Birth control/protection: None  Other Topics Concern   Not on file  Social History Narrative   Lives alone   Left Handed   Drinks tea occassionally   Social Determinants of Health   Financial Resource Strain: Not on file  Food Insecurity: Not on file  Transportation Needs: Not on file  Physical Activity: Not on file  Stress: Not on file  Social Connections: Not on file  Intimate Partner Violence: Not on file    ROS Review of Systems  All other systems reviewed and are negative.  Objective:   Today's Vitals: BP 135/83   Pulse 97   Temp 98.3 F (36.8 C) (Oral)   Resp 16   Ht 5\' 1"  (1.549 m)   Wt 95 lb 12.8 oz (43.5 kg)   LMP 11/15/2013    SpO2 98%   BMI 18.10 kg/m   Physical Exam Vitals and nursing note reviewed.  Constitutional:      General: She is not in acute distress. Cardiovascular:     Rate and Rhythm: Normal rate and regular rhythm.  Pulmonary:     Effort: Pulmonary effort is normal.     Breath sounds: Normal breath sounds.  Neurological:     General: No focal deficit present.     Mental Status: She is alert and oriented to person, place, and time.  Psychiatric:        Mood and Affect: Mood is anxious.        Behavior: Behavior normal.    Assessment & Plan:   1. Essential hypertension Appears stable. Continue present management  2. Hyperlipidemia, unspecified hyperlipidemia type Lab work noted elevated lipids. Patient prescribed lipitor 20 mg. monitor    Outpatient Encounter Medications as of 07/24/2021  Medication Sig   amLODipine (NORVASC) 10 MG tablet Take 1 tablet (10 mg total) by mouth daily.   atorvastatin (LIPITOR) 20 MG tablet Take 1 tablet (20 mg total) by mouth daily.   No facility-administered encounter medications on file as of 07/24/2021.  Follow-up: Return in about 3 months (around 10/22/2021) for follow up.   Tommie Raymond, MD

## 2021-09-04 ENCOUNTER — Other Ambulatory Visit: Payer: Self-pay | Admitting: *Deleted

## 2021-09-04 DIAGNOSIS — I10 Essential (primary) hypertension: Secondary | ICD-10-CM

## 2021-09-04 MED ORDER — AMLODIPINE BESYLATE 10 MG PO TABS: 10.0000 mg | ORAL_TABLET | Freq: Every day | ORAL | 1 refills | Status: DC

## 2021-10-16 ENCOUNTER — Other Ambulatory Visit: Payer: Self-pay

## 2021-10-16 ENCOUNTER — Other Ambulatory Visit: Payer: 59

## 2021-10-16 ENCOUNTER — Ambulatory Visit: Payer: 59

## 2021-10-16 DIAGNOSIS — Z1329 Encounter for screening for other suspected endocrine disorder: Secondary | ICD-10-CM

## 2021-10-16 DIAGNOSIS — Z862 Personal history of diseases of the blood and blood-forming organs and certain disorders involving the immune mechanism: Secondary | ICD-10-CM

## 2021-10-16 DIAGNOSIS — E785 Hyperlipidemia, unspecified: Secondary | ICD-10-CM

## 2021-10-16 DIAGNOSIS — Z131 Encounter for screening for diabetes mellitus: Secondary | ICD-10-CM

## 2021-10-16 DIAGNOSIS — E559 Vitamin D deficiency, unspecified: Secondary | ICD-10-CM

## 2021-10-16 DIAGNOSIS — I10 Essential (primary) hypertension: Secondary | ICD-10-CM

## 2021-10-17 LAB — CBC WITH DIFFERENTIAL/PLATELET
Basophils Absolute: 0 10*3/uL (ref 0.0–0.2)
Basos: 1 %
EOS (ABSOLUTE): 0.1 10*3/uL (ref 0.0–0.4)
Eos: 2 %
Hematocrit: 41.3 % (ref 34.0–46.6)
Hemoglobin: 12.9 g/dL (ref 11.1–15.9)
Immature Grans (Abs): 0 10*3/uL (ref 0.0–0.1)
Immature Granulocytes: 0 %
Lymphocytes Absolute: 1.5 10*3/uL (ref 0.7–3.1)
Lymphs: 38 %
MCH: 25.7 pg — ABNORMAL LOW (ref 26.6–33.0)
MCHC: 31.2 g/dL — ABNORMAL LOW (ref 31.5–35.7)
MCV: 82 fL (ref 79–97)
Monocytes Absolute: 0.5 10*3/uL (ref 0.1–0.9)
Monocytes: 12 %
Neutrophils Absolute: 1.9 10*3/uL (ref 1.4–7.0)
Neutrophils: 47 %
Platelets: 316 10*3/uL (ref 150–450)
RBC: 5.01 x10E6/uL (ref 3.77–5.28)
RDW: 13.3 % (ref 11.7–15.4)
WBC: 4 10*3/uL (ref 3.4–10.8)

## 2021-10-17 LAB — CMP14+EGFR
ALT: 17 IU/L (ref 0–32)
AST: 26 IU/L (ref 0–40)
Albumin/Globulin Ratio: 2 (ref 1.2–2.2)
Albumin: 5.2 g/dL — ABNORMAL HIGH (ref 3.8–4.9)
Alkaline Phosphatase: 106 IU/L (ref 44–121)
BUN/Creatinine Ratio: 8 — ABNORMAL LOW (ref 9–23)
BUN: 8 mg/dL (ref 6–24)
Bilirubin Total: 0.7 mg/dL (ref 0.0–1.2)
CO2: 24 mmol/L (ref 20–29)
Calcium: 10.1 mg/dL (ref 8.7–10.2)
Chloride: 101 mmol/L (ref 96–106)
Creatinine, Ser: 1.06 mg/dL — ABNORMAL HIGH (ref 0.57–1.00)
Globulin, Total: 2.6 g/dL (ref 1.5–4.5)
Glucose: 89 mg/dL (ref 70–99)
Potassium: 5.1 mmol/L (ref 3.5–5.2)
Sodium: 144 mmol/L (ref 134–144)
Total Protein: 7.8 g/dL (ref 6.0–8.5)
eGFR: 64 mL/min/{1.73_m2} (ref >59)

## 2021-10-17 LAB — TSH: TSH: 0.683 u[IU]/mL (ref 0.450–4.500)

## 2021-10-17 LAB — LIPID PANEL
Chol/HDL Ratio: 1.6 ratio (ref 0.0–4.4)
Cholesterol, Total: 226 mg/dL — ABNORMAL HIGH (ref 100–199)
HDL: 144 mg/dL (ref >39)
LDL Chol Calc (NIH): 73 mg/dL (ref 0–99)
Triglycerides: 53 mg/dL (ref 0–149)
VLDL Cholesterol Cal: 9 mg/dL (ref 5–40)

## 2021-10-17 LAB — HEMOGLOBIN A1C
Est. average glucose Bld gHb Est-mCnc: 117 mg/dL
Hgb A1c MFr Bld: 5.7 % — ABNORMAL HIGH (ref 4.8–5.6)

## 2021-10-17 LAB — VITAMIN D 25 HYDROXY (VIT D DEFICIENCY, FRACTURES): Vit D, 25-Hydroxy: 46.8 ng/mL (ref 30.0–100.0)

## 2021-10-23 ENCOUNTER — Other Ambulatory Visit: Payer: Self-pay

## 2021-10-23 ENCOUNTER — Encounter: Payer: 59 | Admitting: Family Medicine

## 2021-10-23 ENCOUNTER — Ambulatory Visit: Payer: 59 | Admitting: Family

## 2021-10-23 NOTE — Progress Notes (Signed)
Patient wanted to verify or review labs. This was done and questions answered ?

## 2022-01-01 ENCOUNTER — Other Ambulatory Visit: Payer: Self-pay | Admitting: *Deleted

## 2022-01-01 MED ORDER — ATORVASTATIN CALCIUM 20 MG PO TABS: 20.0000 mg | ORAL_TABLET | Freq: Every day | ORAL | 0 refills | Status: DC

## 2022-01-27 ENCOUNTER — Other Ambulatory Visit: Payer: Self-pay | Admitting: Family Medicine

## 2022-01-27 DIAGNOSIS — I10 Essential (primary) hypertension: Secondary | ICD-10-CM

## 2022-01-27 NOTE — Telephone Encounter (Signed)
Schedule appointment?

## 2022-01-28 ENCOUNTER — Ambulatory Visit: Payer: Self-pay | Admitting: *Deleted

## 2022-01-28 NOTE — Telephone Encounter (Signed)
Patient reports she received a call from [facility]. To contact PCP regarding medication refills. In review of chart , A. Zonia Kief, NP documented on 01/27/22 to schedule appt. Patient reports she was last seen and lab work completed in March. Review of chart shows last OV 07/24/21 and labs 10/16/21 and canceled ( schedule order error) on 10/23/21. Patient reports she was seen and should be able to receive refills. Please advise. Patient would like a call back today regarding refills from PCP for norvasc 10 mg and lipitor 20 mg.     Reason for Disposition  [1] Prescription refill request for ESSENTIAL medicine (i.e., likelihood of harm to patient if not taken) AND [2] triager unable to refill per department policy  Answer Assessment - Initial Assessment Questions 1. DRUG NAME: "What medicine do you need to have refilled?"     Norvasc 10 mg and lipitor 20 mg  2. REFILLS REMAINING: "How many refills are remaining?" (Note: The label on the medicine or pill bottle will show how many refills are remaining. If there are no refills remaining, then a renewal may be needed.)     0 3. EXPIRATION DATE: "What is the expiration date?" (Note: The label states when the prescription will expire, and thus can no longer be refilled.)     na 4. PRESCRIBING HCP: "Who prescribed it?" Reason: If prescribed by specialist, call should be referred to that group.     PCP 5. SYMPTOMS: "Do you have any symptoms?"     Almost out of medication 6. PREGNANCY: "Is there any chance that you are pregnant?" "When was your last menstrual period?"     na  Protocols used: Medication Refill and Renewal Call-A-AH

## 2022-02-02 ENCOUNTER — Telehealth: Payer: Self-pay | Admitting: Family Medicine

## 2022-02-02 NOTE — Telephone Encounter (Signed)
Patient called back as follow up to call 01/28/22- has not heard from office. Call transferred to Donna(FC) to assist patient.

## 2022-02-02 NOTE — Telephone Encounter (Signed)
Pt is needing both of her RX's refilled. There is a note from Amy on 01-27-22 denying the refill on the Amlodipine due to needing an office visit within six months. Pt was here in March and said that she had bloodwork then. There appears to be a refill available on the Amlodipine but will need a refill on her Lipitor. Please advise if there is an issue filling these.

## 2022-02-05 ENCOUNTER — Other Ambulatory Visit: Payer: Self-pay | Admitting: *Deleted

## 2022-02-05 DIAGNOSIS — I10 Essential (primary) hypertension: Secondary | ICD-10-CM

## 2022-02-05 MED ORDER — AMLODIPINE BESYLATE 10 MG PO TABS: 10.0000 mg | ORAL_TABLET | Freq: Every day | ORAL | 1 refills | Status: DC

## 2022-02-05 MED ORDER — ATORVASTATIN CALCIUM 20 MG PO TABS: 20.0000 mg | ORAL_TABLET | Freq: Every day | ORAL | 1 refills | Status: DC

## 2022-02-05 NOTE — Telephone Encounter (Signed)
Medication was sent to pharmacy.

## 2022-03-17 ENCOUNTER — Other Ambulatory Visit: Payer: Self-pay | Admitting: Family Medicine

## 2022-03-17 DIAGNOSIS — Z1231 Encounter for screening mammogram for malignant neoplasm of breast: Secondary | ICD-10-CM

## 2022-05-06 ENCOUNTER — Ambulatory Visit
Admission: RE | Admit: 2022-05-06 | Discharge: 2022-05-06 | Disposition: A | Discharge status: Home / Self Care | Payer: 59 | Source: Ambulatory Visit | Attending: Family Medicine | Admitting: Family Medicine

## 2022-05-06 DIAGNOSIS — Z1231 Encounter for screening mammogram for malignant neoplasm of breast: Secondary | ICD-10-CM

## 2022-06-16 ENCOUNTER — Ambulatory Visit: Payer: 59 | Admitting: Family Medicine

## 2022-09-10 ENCOUNTER — Encounter: Payer: Self-pay | Admitting: Family Medicine

## 2022-09-10 ENCOUNTER — Ambulatory Visit (INDEPENDENT_AMBULATORY_CARE_PROVIDER_SITE_OTHER): Payer: 59 | Admitting: Family Medicine

## 2022-09-10 VITALS — BP 109/74 | HR 96 | Temp 98.0°F | Resp 16 | Ht 61.0 in | Wt 99.6 lb

## 2022-09-10 DIAGNOSIS — I10 Essential (primary) hypertension: Secondary | ICD-10-CM | POA: Diagnosis not present

## 2022-09-10 DIAGNOSIS — E785 Hyperlipidemia, unspecified: Secondary | ICD-10-CM

## 2022-09-10 MED ORDER — AMLODIPINE BESYLATE 10 MG PO TABS: 10.0000 mg | ORAL_TABLET | Freq: Every day | ORAL | 1 refills | Status: DC

## 2022-09-10 MED ORDER — ATORVASTATIN CALCIUM 20 MG PO TABS: 20.0000 mg | ORAL_TABLET | Freq: Every day | ORAL | 1 refills | Status: DC

## 2022-09-10 NOTE — Progress Notes (Unsigned)
Patient is here for their 6 month follow-up Patient has no concerns today Care gaps have been discussed with patient  

## 2022-09-11 LAB — CMP14+EGFR
ALT: 22 IU/L (ref 0–32)
AST: 26 IU/L (ref 0–40)
Albumin/Globulin Ratio: 2 (ref 1.2–2.2)
Albumin: 4.7 g/dL (ref 3.8–4.9)
Alkaline Phosphatase: 117 IU/L (ref 44–121)
BUN/Creatinine Ratio: 10 (ref 9–23)
BUN: 10 mg/dL (ref 6–24)
Bilirubin Total: 0.7 mg/dL (ref 0.0–1.2)
CO2: 24 mmol/L (ref 20–29)
Calcium: 9.9 mg/dL (ref 8.7–10.2)
Chloride: 104 mmol/L (ref 96–106)
Creatinine, Ser: 1.04 mg/dL — ABNORMAL HIGH (ref 0.57–1.00)
Globulin, Total: 2.4 g/dL (ref 1.5–4.5)
Glucose: 85 mg/dL (ref 70–99)
Potassium: 4.8 mmol/L (ref 3.5–5.2)
Sodium: 143 mmol/L (ref 134–144)
Total Protein: 7.1 g/dL (ref 6.0–8.5)
eGFR: 65 mL/min/{1.73_m2} (ref >59)

## 2022-09-11 LAB — LIPID PANEL
Chol/HDL Ratio: 1.7 ratio (ref 0.0–4.4)
Cholesterol, Total: 205 mg/dL — ABNORMAL HIGH (ref 100–199)
HDL: 122 mg/dL (ref >39)
LDL Chol Calc (NIH): 74 mg/dL (ref 0–99)
Triglycerides: 50 mg/dL (ref 0–149)
VLDL Cholesterol Cal: 9 mg/dL (ref 5–40)

## 2022-09-13 ENCOUNTER — Encounter: Payer: Self-pay | Admitting: Family Medicine

## 2022-09-13 NOTE — Progress Notes (Signed)
Established Patient Office Visit  Subjective    Patient ID: Laura Estes, female    DOB: 1971/02/25  Age: 52 y.o. MRN: [id number]  CC:  Chief Complaint  Patient presents with   Hypertension   Follow-up    HPI Laura Estes presents for routine follow up of chronic med issues. Patient denies acute complaints or concerns.    Outpatient Encounter Medications as of 09/10/2022  Medication Sig   [DISCONTINUED] amLODipine (NORVASC) 10 MG tablet Take 1 tablet (10 mg total) by mouth daily.   [DISCONTINUED] atorvastatin (LIPITOR) 20 MG tablet Take 1 tablet (20 mg total) by mouth daily.   amLODipine (NORVASC) 10 MG tablet Take 1 tablet (10 mg total) by mouth daily.   atorvastatin (LIPITOR) 20 MG tablet Take 1 tablet (20 mg total) by mouth daily.   No facility-administered encounter medications on file as of 09/10/2022.    Past Medical History:  Diagnosis Date   Hypertension     History reviewed. No pertinent surgical history.  Family History  Problem Relation Age of Onset   Lung cancer Mother    Hypertension Father    Heart disease Father    Breast cancer Maternal Aunt 64   Cancer Maternal Grandmother        ovarian and uterine   Breast cancer Cousin        16s   Breast cancer Cousin        74s    Social History   Socioeconomic History   Marital status: Single    Spouse name: Not on file   Number of children: 0   Years of education: Not on file   Highest education level: Some college, no degree  Occupational History   Occupation: full time  Tobacco Use   Smoking status: Never   Smokeless tobacco: Never  Vaping Use   Vaping Use: Never used  Substance and Sexual Activity   Alcohol use: No    Alcohol/week: 0.0 standard drinks of alcohol   Drug use: No   Sexual activity: Not Currently    Birth control/protection: None  Other Topics Concern   Not on file  Social History Narrative   Lives alone   Left Handed   Drinks tea occassionally   Social Determinants  of Health   Financial Resource Strain: Not on file  Food Insecurity: Not on file  Transportation Needs: No Transportation Needs (01/04/2019)   PRAPARE - Hydrologist (Medical): No    Lack of Transportation (Non-Medical): No  Physical Activity: Not on file  Stress: Not on file  Social Connections: Not on file  Intimate Partner Violence: Not on file    Review of Systems  All other systems reviewed and are negative.       Objective    BP 109/74   Pulse 96   Temp 98 F (36.7 C) (Oral)   Resp 16   Ht 5\' 1"  (1.549 m)   Wt 99 lb 9.6 oz (45.2 kg)   LMP 11/15/2013   SpO2 98%   BMI 18.82 kg/m   Physical Exam Vitals and nursing note reviewed.  Constitutional:      General: She is not in acute distress. Cardiovascular:     Rate and Rhythm: Normal rate and regular rhythm.  Pulmonary:     Effort: Pulmonary effort is normal.     Breath sounds: Normal breath sounds.  Neurological:     General: No focal deficit present.     Mental  Status: She is alert and oriented to person, place, and time.  Psychiatric:        Mood and Affect: Mood is anxious.        Behavior: Behavior normal.         Assessment & Plan:   1. Essential hypertension Appears stable. Continue. Monitoring labs ordered.  - CMP14+EGFR  2. Hyperlipidemia, unspecified hyperlipidemia type Monitoring labs ordered.  - Lipid Panel    Return in about 6 months (around 03/11/2023) for follow up.   Becky Sax, MD

## 2022-09-25 ENCOUNTER — Ambulatory Visit (HOSPITAL_COMMUNITY)
Admission: EM | Admit: 2022-09-25 | Discharge: 2022-09-25 | Disposition: A | Discharge status: Home / Self Care | Payer: 59 | Attending: Physician Assistant | Admitting: Physician Assistant

## 2022-09-25 ENCOUNTER — Other Ambulatory Visit: Payer: Self-pay

## 2022-09-25 ENCOUNTER — Ambulatory Visit
Admission: EM | Admit: 2022-09-25 | Discharge: 2022-09-25 | Disposition: A | Discharge status: Home / Self Care | Payer: 59

## 2022-09-25 ENCOUNTER — Encounter (HOSPITAL_COMMUNITY): Payer: Self-pay | Admitting: Emergency Medicine

## 2022-09-25 DIAGNOSIS — L02213 Cutaneous abscess of chest wall: Secondary | ICD-10-CM | POA: Diagnosis not present

## 2022-09-25 DIAGNOSIS — N611 Abscess of the breast and nipple: Secondary | ICD-10-CM

## 2022-09-25 MED ORDER — LIDOCAINE HCL 2 % IJ SOLN
INTRAMUSCULAR | Status: AC
  Filled 2022-09-25: qty 20

## 2022-09-25 MED ORDER — SULFAMETHOXAZOLE-TRIMETHOPRIM 800-160 MG PO TABS: 1.0000 | ORAL_TABLET | Freq: Two times a day (BID) | ORAL | 0 refills | Status: AC

## 2022-09-25 MED ORDER — IBUPROFEN 600 MG PO TABS: 600.0000 mg | ORAL_TABLET | Freq: Four times a day (QID) | ORAL | 0 refills | Status: AC | PRN

## 2022-09-25 NOTE — ED Provider Notes (Addendum)
[facility]    CSN: [id number] Arrival date & time: 09/25/22  [id number]      History   Chief Complaint Chief Complaint  Patient presents with   Cyst    HPI Laura Estes is a 52 y.o. female.   Patient presents with abscess like area present to right breast.  She states that this has been an intermittent issue in the past.  States that she has been having routine mammograms where they evaluate the area.  Reports that she was told in September that it was a sebaceous cyst.  Denies any drainage from the area or any associated fevers.  This current flareup started about a week ago.     Past Medical History:  Diagnosis Date   Hypertension     Patient Active Problem List   Diagnosis Date Noted   Essential hypertension 09/08/2020   Hyperlipidemia 09/08/2020   Perimenopausal 09/26/2014   Dyspepsia 06/24/2014   Elevated BP 06/24/2014    History reviewed. No pertinent surgical history.  OB History     Gravida  1   Para      Term      Preterm      AB  1   Living  0      SAB      IAB  1   Ectopic      Multiple      Live Births  0            Home Medications    Prior to Admission medications   Medication Sig Start Date End Date Taking? Authorizing Provider  amLODipine (NORVASC) 10 MG tablet Take 1 tablet (10 mg total) by mouth daily. 09/10/22   Dorna Mai, MD  atorvastatin (LIPITOR) 20 MG tablet Take 1 tablet (20 mg total) by mouth daily. 09/10/22   Dorna Mai, MD    Family History Family History  Problem Relation Age of Onset   Lung cancer Mother    Hypertension Father    Heart disease Father    Breast cancer Maternal Aunt 18   Cancer Maternal Grandmother        ovarian and uterine   Breast cancer Cousin        12s   Breast cancer Cousin        34s    Social History Social History   Tobacco Use   Smoking status: Never   Smokeless tobacco: Never  Vaping Use   Vaping Use: Never used  Substance Use Topics   Alcohol  use: No    Alcohol/week: 0.0 standard drinks of alcohol   Drug use: No     Allergies   Tetracyclines & related   Review of Systems Review of Systems Per HPI  Physical Exam Triage Vital Signs ED Triage Vitals  Enc Vitals Group     BP 09/25/22 0853 120/77     Pulse Rate 09/25/22 0853 (!) 108     Resp 09/25/22 0853 16     Temp 09/25/22 0853 98.5 F (36.9 C)     Temp Source 09/25/22 0853 Oral     SpO2 09/25/22 0853 98 %     Weight --      Height --      Head Circumference --      Peak Flow --      Pain Score 09/25/22 0852 0     Pain Loc --      Pain Edu? --      Excl. in [place]? --  No data found.  Updated Vital Signs BP 120/77   Pulse (!) 108   Temp 98.5 F (36.9 C) (Oral)   Resp 16   LMP 11/15/2013   SpO2 98%   Visual Acuity Right Eye Distance:   Left Eye Distance:   Bilateral Distance:    Right Eye Near:   Left Eye Near:    Bilateral Near:     Physical Exam Exam conducted with a chaperone present.  Constitutional:      General: She is not in acute distress.    Appearance: Normal appearance. She is not toxic-appearing or diaphoretic.  HENT:     Head: Normocephalic and atraumatic.  Eyes:     Extraocular Movements: Extraocular movements intact.     Conjunctiva/sclera: Conjunctivae normal.  Pulmonary:     Effort: Pulmonary effort is normal.  Chest:       Comments: Approximately 2 inch in diameter fluctuant abscess present to left upper portion of right breast.  No drainage noted. Neurological:     General: No focal deficit present.     Mental Status: She is alert and oriented to person, place, and time. Mental status is at baseline.  Psychiatric:        Mood and Affect: Mood normal.        Behavior: Behavior normal.        Thought Content: Thought content normal.        Judgment: Judgment normal.      UC Treatments / Results  Labs (all labs ordered are listed, but only abnormal results are displayed) Labs Reviewed - No data to  display  EKG   Radiology No results found.  Procedures Procedures (including critical care time)  Medications Ordered in UC Medications - No data to display  Initial Impression / Assessment and Plan / UC Course  I have reviewed the triage vital signs and the nursing notes.  Pertinent labs & imaging results that were available during my care of the patient were reviewed by me and considered in my medical decision making (see chart for details).     Patient has abscess to right breast.  It does appear that it needs to be drained given that it is very fluctuant.  Do not have resources here in this urgent care to safely drain breast abscess.  Spoke with MD at [facility] urgent care location who agreed to evaluate patient.  Patient was sent over to [facility] urgent care at [facility] for second provider to evaluate and manage her.  Will defer all treatment and management to second provider.  Patient was agreeable to this plan and left to go over to urgent care.  Patient is mildly tachycardic but is in pain due to abscess so suspect this is attributed to pain.  Do not think any further workup for this is necessary as other vitals are stable. Final Clinical Impressions(s) / UC Diagnoses   Final diagnoses:  Breast abscess     Discharge Instructions      Go to urgent care at [facility] location to have them evaluate you.    ED Prescriptions   None    PDMP not reviewed this encounter.   Teodora Medici, [place] 09/25/22 [place], [place], FNP 09/25/22 [place], [place], [place] 09/25/22 [phone]

## 2022-09-25 NOTE — ED Provider Notes (Signed)
[place]   MRN: [id number] DOB: [birth year]  Subjective:   Laura Estes is a 52 y.o. female presenting for right chest wall abscess x 2 weeks. Very tender. Not draining. No fever or chills. No treatments tried so far at home.   No current facility-administered medications for this encounter.  Current Outpatient Medications:    ibuprofen (ADVIL) 600 MG tablet, Take 1 tablet (600 mg total) by mouth every 6 (six) hours as needed for moderate pain. Take with food., Disp: 20 tablet, Rfl: 0   sulfamethoxazole-trimethoprim (BACTRIM DS) 800-160 MG tablet, Take 1 tablet by mouth 2 (two) times daily for 7 days., Disp: 14 tablet, Rfl: 0   amLODipine (NORVASC) 10 MG tablet, Take 1 tablet (10 mg total) by mouth daily., Disp: 90 tablet, Rfl: 1   atorvastatin (LIPITOR) 20 MG tablet, Take 1 tablet (20 mg total) by mouth daily., Disp: 90 tablet, Rfl: 1   Allergies  Allergen Reactions   Tetracyclines & Related     Past Medical History:  Diagnosis Date   Hypertension      History reviewed. No pertinent surgical history.  Family History  Problem Relation Age of Onset   Lung cancer Mother    Hypertension Father    Heart disease Father    Breast cancer Maternal Aunt 15   Cancer Maternal Grandmother        ovarian and uterine   Breast cancer Cousin        32s   Breast cancer Cousin        84s    Social History   Tobacco Use   Smoking status: Never   Smokeless tobacco: Never  Vaping Use   Vaping Use: Never used  Substance Use Topics   Alcohol use: No    Alcohol/week: 0.0 standard drinks of alcohol   Drug use: No    ROS REFER TO HPI FOR PERTINENT POSITIVES AND NEGATIVES   Objective:   Vitals: BP (!) 151/75 (BP Location: Right Arm)   Pulse (!) 113   Temp 97.7 F (36.5 C) (Oral)   Resp 18   LMP 11/15/2013   SpO2 98%   Physical Exam Vitals and nursing note reviewed.  Constitutional:      Appearance: Normal appearance.  Cardiovascular:     Rate  and Rhythm: Normal rate and regular rhythm.  Pulmonary:     Effort: Pulmonary effort is normal.     Breath sounds: Normal breath sounds.  Skin:    Findings: Lesion (right chest wall large very fluctuant, tender abscess, superficial; approx 5 x 4 cm large. see photo) present.  Neurological:     Mental Status: She is alert.  Psychiatric:        Mood and Affect: Mood normal.        Behavior: Behavior normal.      No results found for this or any previous visit (from the past 24 hour(s)).  Assessment and Plan :   PDMP not reviewed this encounter.  1. Abscess of chest wall    PROCEDURE NOTE: I&D of Abscess Verbal consent obtained. Local anesthesia with 8cc of 1% lidocaine without epi. Site cleansed with iodine. Incision of 0.5 cm was made using an 11 blade, large amount of material expressed consisting of a mixture of pus and serosanguinous fluid. Wound cavity irrigated with normal saline & loculations loosened. Cleansed and dressed. Pt tolerated well, although very anxious and tearful throughout entire procedure.  Aftercare instructions provided. She will take Bactrim  as directed.  Pt aware of risks vs benefits and possible adverse reactions. She will keep area clean and dry.  She will apply warm compresses.  She will take Tylenol and ibuprofen for pain.  She will recheck with [place] or her primary care if worse or any changes.    AllwardtRanda Evens, PA-C 09/25/22 1240

## 2022-09-25 NOTE — Discharge Instructions (Signed)
Very good to meet you today.  You had an abscess of your chest wall drained today.  Please take the bactrim antibiotic as directed. Apply warm compresses several times daily; this may continue to drain the next few days. This is good!   Alternate ibuprofen and Tylenol to help with pain.  Keep area clean and dry, wash gently with soap and water.  If any worsening pain or fever or other symptoms, please recheck with [place] or your PCP.

## 2022-09-25 NOTE — Discharge Instructions (Addendum)
Go to urgent care at [facility] location to have them evaluate you.

## 2022-09-25 NOTE — ED Triage Notes (Signed)
Pt presents to [facility] with co cyst on right chest. Pt has had issues with this before and was cleared by the cancer center the flair up started last week. Pt reports no otc medications.

## 2022-09-25 NOTE — ED Triage Notes (Signed)
Patient has been told this is a sebaceous cyst.  The area is on chest wall, above/left of the right breast.  Patient reports this is the worst episode, never been this large before  Patient went to [facility] urgent care and was told to come to this location for treatment  Has not taken any medications

## 2023-01-07 ENCOUNTER — Encounter: Payer: Self-pay | Admitting: Family Medicine

## 2023-01-07 ENCOUNTER — Ambulatory Visit (INDEPENDENT_AMBULATORY_CARE_PROVIDER_SITE_OTHER): Payer: 59 | Admitting: Family Medicine

## 2023-01-07 VITALS — BP 124/76 | HR 100 | Temp 97.7°F | Resp 16 | Wt 96.0 lb

## 2023-01-07 DIAGNOSIS — R102 Pelvic and perineal pain: Secondary | ICD-10-CM | POA: Diagnosis not present

## 2023-01-07 DIAGNOSIS — I10 Essential (primary) hypertension: Secondary | ICD-10-CM | POA: Diagnosis not present

## 2023-01-07 DIAGNOSIS — E785 Hyperlipidemia, unspecified: Secondary | ICD-10-CM

## 2023-01-07 LAB — POCT URINALYSIS DIP (CLINITEK)
Bilirubin, UA: NEGATIVE
Blood, UA: NEGATIVE
Glucose, UA: NEGATIVE mg/dL
Leukocytes, UA: NEGATIVE
Nitrite, UA: NEGATIVE
Spec Grav, UA: 1.02 (ref 1.010–1.025)
Urobilinogen, UA: 0.2 E.U./dL
pH, UA: 8.5 — AB (ref 5.0–8.0)

## 2023-01-07 MED ORDER — AMLODIPINE BESYLATE 10 MG PO TABS: 10.0000 mg | ORAL_TABLET | Freq: Every day | ORAL | 1 refills | Status: DC

## 2023-01-07 MED ORDER — ATORVASTATIN CALCIUM 20 MG PO TABS: 20.0000 mg | ORAL_TABLET | Freq: Every day | ORAL | 1 refills | Status: DC

## 2023-01-07 NOTE — Progress Notes (Signed)
Established Patient Office Visit  Subjective    Patient ID: Laura Estes, female    DOB: Dec 17, 1970  Age: 52 y.o. MRN: [id number]  CC:  Chief Complaint  Patient presents with   Follow-up    HPI Laura Estes presents for routine follow up of chronic med issues. Patient also reports recent incident of syncope at [place] where she had EMS called and had low blood pressure.    Outpatient Encounter Medications as of 01/07/2023  Medication Sig   amLODipine (NORVASC) 10 MG tablet Take 1 tablet (10 mg total) by mouth daily.   atorvastatin (LIPITOR) 20 MG tablet Take 1 tablet (20 mg total) by mouth daily.   ibuprofen (ADVIL) 600 MG tablet Take 1 tablet (600 mg total) by mouth every 6 (six) hours as needed for moderate pain. Take with food.   No facility-administered encounter medications on file as of 01/07/2023.    Past Medical History:  Diagnosis Date   Hypertension     No past surgical history on file.  Family History  Problem Relation Age of Onset   Lung cancer Mother    Hypertension Father    Heart disease Father    Breast cancer Maternal Aunt 30   Cancer Maternal Grandmother        ovarian and uterine   Breast cancer Cousin        76s   Breast cancer Cousin        8s    Social History   Socioeconomic History   Marital status: Single    Spouse name: Not on file   Number of children: 0   Years of education: Not on file   Highest education level: Some college, no degree  Occupational History   Occupation: full time  Tobacco Use   Smoking status: Never   Smokeless tobacco: Never  Vaping Use   Vaping Use: Never used  Substance and Sexual Activity   Alcohol use: No    Alcohol/week: 0.0 standard drinks of alcohol   Drug use: No   Sexual activity: Not Currently    Birth control/protection: None  Other Topics Concern   Not on file  Social History Narrative   Lives alone   Left Handed   Drinks tea occassionally   Social Determinants of Health    Financial Resource Strain: Patient Declined (01/07/2023)   Overall Financial Resource Strain (CARDIA)    Difficulty of Paying Living Expenses: Patient declined  Food Insecurity: Patient Declined (01/07/2023)   Hunger Vital Sign    Worried About Running Out of Food in the Last Year: Patient declined    Ran Out of Food in the Last Year: Patient declined  Transportation Needs: Patient Declined (01/07/2023)   PRAPARE - Administrator, Civil Service (Medical): Patient declined    Lack of Transportation (Non-Medical): Patient declined  Physical Activity: Insufficiently Active (01/07/2023)   Exercise Vital Sign    Days of Exercise per Week: 3 days    Minutes of Exercise per Session: 30 min  Stress: No Stress Concern Present (01/07/2023)   [employer] of Occupational Health - Occupational Stress Questionnaire    Feeling of Stress : Not at all  Social Connections: Unknown (01/07/2023)   Social Connection and Isolation Panel [NHANES]    Frequency of Communication with Friends and Family: Twice a week    Frequency of Social Gatherings with Friends and Family: Twice a week    Attends Religious Services: Patient declined    Active Member of  Clubs or Organizations: No    Attends Banker Meetings: Not on file    Marital Status: Patient declined  Intimate Partner Violence: Not on file    Review of Systems  All other systems reviewed and are negative.       Objective    BP 124/76   Pulse 100   Temp 97.7 F (36.5 C) (Oral)   Resp 16   Wt 96 lb (43.5 kg)   LMP 11/15/2013   SpO2 98%   BMI 18.14 kg/m   Physical Exam Vitals and nursing note reviewed.  Constitutional:      General: She is not in acute distress. Cardiovascular:     Rate and Rhythm: Normal rate and regular rhythm.  Pulmonary:     Effort: Pulmonary effort is normal.     Breath sounds: Normal breath sounds.  Neurological:     General: No focal deficit present.     Mental Status: She is  alert and oriented to person, place, and time.  Psychiatric:        Mood and Affect: Mood normal.        Behavior: Behavior normal.         Assessment & Plan:   1. Essential hypertension Appears stable. continue  2. Hyperlipidemia, unspecified hyperlipidemia type Continue   3. Pelvic pain UA unremarkable. Patient to follow up with gyn if sx persist or worsen. - POCT URINALYSIS DIP (CLINITEK)   No follow-ups on file.   Tommie Raymond, MD

## 2023-01-07 NOTE — Progress Notes (Unsigned)
Patient is here for their 3 month follow-up Patient has no concerns today Care gaps have been discussed with patient  

## 2023-01-11 ENCOUNTER — Telehealth: Payer: Self-pay | Admitting: *Deleted

## 2023-01-11 NOTE — Telephone Encounter (Signed)
Patient calling for lab results. In review of chart, 01/07/23 labs, no response from PCP at this time. Patient requesting call back when PCP interprets results.

## 2023-01-13 ENCOUNTER — Encounter: Payer: Self-pay | Admitting: Family Medicine

## 2023-01-28 ENCOUNTER — Ambulatory Visit: Payer: 59 | Admitting: Family

## 2023-04-01 ENCOUNTER — Other Ambulatory Visit: Payer: Self-pay | Admitting: Family Medicine

## 2023-04-01 DIAGNOSIS — Z1231 Encounter for screening mammogram for malignant neoplasm of breast: Secondary | ICD-10-CM

## 2023-05-13 ENCOUNTER — Ambulatory Visit
Admission: RE | Admit: 2023-05-13 | Discharge: 2023-05-13 | Disposition: A | Discharge status: Home / Self Care | Payer: 59 | Source: Ambulatory Visit | Attending: Family Medicine | Admitting: Family Medicine

## 2023-05-13 DIAGNOSIS — Z1231 Encounter for screening mammogram for malignant neoplasm of breast: Secondary | ICD-10-CM

## 2023-07-31 ENCOUNTER — Other Ambulatory Visit: Payer: Self-pay | Admitting: Family Medicine

## 2023-08-01 ENCOUNTER — Ambulatory Visit (INDEPENDENT_AMBULATORY_CARE_PROVIDER_SITE_OTHER): Payer: 59 | Admitting: Family Medicine

## 2023-08-01 ENCOUNTER — Encounter: Payer: Self-pay | Admitting: Family Medicine

## 2023-08-01 VITALS — BP 134/75 | HR 76 | Temp 97.6°F | Resp 16 | Wt 90.8 lb

## 2023-08-01 DIAGNOSIS — I10 Essential (primary) hypertension: Secondary | ICD-10-CM

## 2023-08-01 DIAGNOSIS — E785 Hyperlipidemia, unspecified: Secondary | ICD-10-CM

## 2023-08-01 DIAGNOSIS — Z1211 Encounter for screening for malignant neoplasm of colon: Secondary | ICD-10-CM | POA: Diagnosis not present

## 2023-08-01 DIAGNOSIS — R634 Abnormal weight loss: Secondary | ICD-10-CM | POA: Diagnosis not present

## 2023-08-01 MED ORDER — ATORVASTATIN CALCIUM 20 MG PO TABS: 20.0000 mg | ORAL_TABLET | Freq: Every day | ORAL | 1 refills | Status: DC

## 2023-08-01 MED ORDER — AMLODIPINE BESYLATE 10 MG PO TABS: 10.0000 mg | ORAL_TABLET | Freq: Every day | ORAL | 1 refills | Status: DC

## 2023-08-01 NOTE — Progress Notes (Signed)
Patient is here for their 6 month follow-up Patient has concerns today regarding her  blood work. Care gaps have been discussed with patient

## 2023-08-02 ENCOUNTER — Encounter: Payer: Self-pay | Admitting: Family Medicine

## 2023-08-02 NOTE — Progress Notes (Signed)
Established Patient Office Visit  Subjective    Patient ID: Laura Estes, female    DOB: [birth year]  Age: 52 y.o. MRN: [id number]  CC:  Chief Complaint  Patient presents with   Medical Management of Chronic Issues    HPI Laura Estes presents for routine follow up of hypertension and hyperlipidemia.She reports taking her meds as recommended. She has lost about 6 lbs since last seen and she wants something to increase her weight.   Outpatient Encounter Medications as of 08/01/2023  Medication Sig   ibuprofen (ADVIL) 600 MG tablet Take 1 tablet (600 mg total) by mouth every 6 (six) hours as needed for moderate pain. Take with food.   [DISCONTINUED] amLODipine (NORVASC) 10 MG tablet Take 1 tablet (10 mg total) by mouth daily.   [DISCONTINUED] atorvastatin (LIPITOR) 20 MG tablet Take 1 tablet (20 mg total) by mouth daily.   amLODipine (NORVASC) 10 MG tablet Take 1 tablet (10 mg total) by mouth daily.   atorvastatin (LIPITOR) 20 MG tablet Take 1 tablet (20 mg total) by mouth daily.   No facility-administered encounter medications on file as of 08/01/2023.    Past Medical History:  Diagnosis Date   Hypertension     History reviewed. No pertinent surgical history.  Family History  Problem Relation Age of Onset   Lung cancer Mother    Hypertension Father    Heart disease Father    Breast cancer Maternal Aunt 30   Cancer Maternal Grandmother        ovarian and uterine   Breast cancer Cousin        7s   Breast cancer Cousin        70s    Social History   Socioeconomic History   Marital status: Single    Spouse name: Not on file   Number of children: 0   Years of education: Not on file   Highest education level: Some college, no degree  Occupational History   Occupation: full time  Tobacco Use   Smoking status: Never   Smokeless tobacco: Never  Vaping Use   Vaping status: Never Used  Substance and Sexual Activity   Alcohol use: No    Alcohol/week: 0.0  standard drinks of alcohol   Drug use: No   Sexual activity: Not Currently    Birth control/protection: None  Other Topics Concern   Not on file  Social History Narrative   Lives alone   Left Handed   Drinks tea occassionally   Social Drivers of Health   Financial Resource Strain: Low Risk  (08/01/2023)   Overall Financial Resource Strain (CARDIA)    Difficulty of Paying Living Expenses: Not very hard  Food Insecurity: No Food Insecurity (08/01/2023)   Hunger Vital Sign    Worried About Running Out of Food in the Last Year: Never true    Ran Out of Food in the Last Year: Never true  Transportation Needs: No Transportation Needs (08/01/2023)   PRAPARE - Administrator, Civil Service (Medical): No    Lack of Transportation (Non-Medical): No  Physical Activity: Insufficiently Active (01/07/2023)   Exercise Vital Sign    Days of Exercise per Week: 3 days    Minutes of Exercise per Session: 30 min  Stress: No Stress Concern Present (01/07/2023)   [employer] of Occupational Health - Occupational Stress Questionnaire    Feeling of Stress : Not at all  Social Connections: Unknown (08/01/2023)   Social Connection and Isolation  Panel [NHANES]    Frequency of Communication with Friends and Family: Twice a week    Frequency of Social Gatherings with Friends and Family: Twice a week    Attends Religious Services: Patient declined    Database administrator or Organizations: No    Attends Banker Meetings: Never    Marital Status: Patient declined  Catering manager Violence: Not At Risk (08/01/2023)   Humiliation, Afraid, Rape, and Kick questionnaire    Fear of Current or Ex-Partner: No    Emotionally Abused: No    Physically Abused: No    Sexually Abused: No    Review of Systems  Constitutional:  Positive for weight loss.  All other systems reviewed and are negative.       Objective    BP 134/75   Pulse 76   Temp 97.6 F (36.4 C) (Oral)    Resp 16   Wt 90 lb 12.8 oz (41.2 kg)   LMP 11/15/2013   SpO2 99%   BMI 17.16 kg/m   Physical Exam Vitals and nursing note reviewed.  Constitutional:      General: She is not in acute distress. Cardiovascular:     Rate and Rhythm: Normal rate and regular rhythm.  Pulmonary:     Effort: Pulmonary effort is normal.     Breath sounds: Normal breath sounds.  Neurological:     General: No focal deficit present.     Mental Status: She is alert and oriented to person, place, and time.  Psychiatric:        Mood and Affect: Mood normal.        Behavior: Behavior normal.         Assessment & Plan:   Essential hypertension  Hyperlipidemia, unspecified hyperlipidemia type  Loss of weight  Screening for colon cancer -     Cologuard  Other orders -     Atorvastatin Calcium; Take 1 tablet (20 mg total) by mouth daily.  Dispense: 90 tablet; Refill: 1 -     amLODIPine Besylate; Take 1 tablet (10 mg total) by mouth daily.  Dispense: 90 tablet; Refill: 1     Return in about 3 months (around 10/30/2023) for follow up.   Tommie Raymond, MD

## 2023-08-21 LAB — COLOGUARD: COLOGUARD: NEGATIVE

## 2023-08-26 ENCOUNTER — Ambulatory Visit: Payer: 59 | Admitting: Family Medicine

## 2024-01-18 ENCOUNTER — Other Ambulatory Visit: Payer: Self-pay | Admitting: Family Medicine

## 2024-02-02 ENCOUNTER — Ambulatory Visit (INDEPENDENT_AMBULATORY_CARE_PROVIDER_SITE_OTHER): Payer: 59 | Admitting: Family Medicine

## 2024-02-02 ENCOUNTER — Encounter: Payer: Self-pay | Admitting: Family Medicine

## 2024-02-02 VITALS — BP 113/74 | HR 88 | Wt 93.6 lb

## 2024-02-02 DIAGNOSIS — Z130 Encounter for screening for diseases of the blood and blood-forming organs and certain disorders involving the immune mechanism: Secondary | ICD-10-CM

## 2024-02-02 DIAGNOSIS — Z1329 Encounter for screening for other suspected endocrine disorder: Secondary | ICD-10-CM

## 2024-02-02 DIAGNOSIS — Z13220 Encounter for screening for lipoid disorders: Secondary | ICD-10-CM

## 2024-02-02 DIAGNOSIS — Z1159 Encounter for screening for other viral diseases: Secondary | ICD-10-CM

## 2024-02-02 DIAGNOSIS — Z0000 Encounter for general adult medical examination without abnormal findings: Secondary | ICD-10-CM | POA: Diagnosis not present

## 2024-02-02 DIAGNOSIS — Z13228 Encounter for screening for other metabolic disorders: Secondary | ICD-10-CM

## 2024-02-02 LAB — LIPID PANEL
A1c: 5.5
EGFR: 65
TSH: 0.38 — AB (ref 0.41–5.90)

## 2024-02-02 MED ORDER — ATORVASTATIN CALCIUM 20 MG PO TABS: 20.0000 mg | ORAL_TABLET | Freq: Every day | ORAL | 1 refills | Status: AC

## 2024-02-02 MED ORDER — AMLODIPINE BESYLATE 10 MG PO TABS: 10.0000 mg | ORAL_TABLET | Freq: Every day | ORAL | 1 refills | Status: AC

## 2024-02-02 NOTE — Progress Notes (Unsigned)
 Established Patient Office Visit  Subjective    Patient ID: Laura Estes, female    DOB: Oct 06, 1970  Age: 53 y.o. MRN: [id number]  CC:  Chief Complaint  Patient presents with   Medication Refill   Medical Management of Chronic Issues    Patient wants to go over results from cologurad and iron pills     HPI Laura Estes presents for routine annual exam. Patient denies acute complaints.   Outpatient Encounter Medications as of 02/02/2024  Medication Sig   ibuprofen  (ADVIL ) 600 MG tablet Take 1 tablet (600 mg total) by mouth every 6 (six) hours as needed for moderate pain. Take with food.   [DISCONTINUED] amLODipine  (NORVASC ) 10 MG tablet Take 1 tablet by mouth once daily   amLODipine  (NORVASC ) 10 MG tablet Take 1 tablet (10 mg total) by mouth daily.   atorvastatin  (LIPITOR) 20 MG tablet Take 1 tablet (20 mg total) by mouth daily.   [DISCONTINUED] atorvastatin  (LIPITOR) 20 MG tablet Take 1 tablet (20 mg total) by mouth daily. (Patient not taking: Reported on 02/02/2024)   No facility-administered encounter medications on file as of 02/02/2024.    Past Medical History:  Diagnosis Date   Hypertension     History reviewed. No pertinent surgical history.  Family History  Problem Relation Age of Onset   Lung cancer Mother    Hypertension Father    Heart disease Father    Breast cancer Maternal Aunt 30   Cancer Maternal Grandmother        ovarian and uterine   Breast cancer Cousin        32s   Breast cancer Cousin        43s    Social History   Socioeconomic History   Marital status: Single    Spouse name: Not on file   Number of children: 0   Years of education: Not on file   Highest education level: Some college, no degree  Occupational History   Occupation: full time  Tobacco Use   Smoking status: Never   Smokeless tobacco: Never  Vaping Use   Vaping status: Never Used  Substance and Sexual Activity   Alcohol use: No    Alcohol/week: 0.0 standard drinks of  alcohol   Drug use: No   Sexual activity: Not Currently    Birth control/protection: None  Other Topics Concern   Not on file  Social History Narrative   Lives alone   Left Handed   Drinks tea occassionally   Social Drivers of Health   Financial Resource Strain: Low Risk  (08/01/2023)   Overall Financial Resource Strain (CARDIA)    Difficulty of Paying Living Expenses: Not very hard  Food Insecurity: No Food Insecurity (08/01/2023)   Hunger Vital Sign    Worried About Running Out of Food in the Last Year: Never true    Ran Out of Food in the Last Year: Never true  Transportation Needs: No Transportation Needs (08/01/2023)   PRAPARE - Administrator, Civil Service (Medical): No    Lack of Transportation (Non-Medical): No  Physical Activity: Insufficiently Active (01/07/2023)   Exercise Vital Sign    Days of Exercise per Week: 3 days    Minutes of Exercise per Session: 30 min  Stress: No Stress Concern Present (01/07/2023)   [employer] of Occupational Health - Occupational Stress Questionnaire    Feeling of Stress : Not at all  Social Connections: Unknown (08/01/2023)   Social Connection and Isolation  Panel    Frequency of Communication with Friends and Family: Twice a week    Frequency of Social Gatherings with Friends and Family: Twice a week    Attends Religious Services: Patient declined    Database administrator or Organizations: No    Attends Banker Meetings: Never    Marital Status: Patient declined  Catering manager Violence: Not At Risk (08/01/2023)   Humiliation, Afraid, Rape, and Kick questionnaire    Fear of Current or Ex-Partner: No    Emotionally Abused: No    Physically Abused: No    Sexually Abused: No    Review of Systems  All other systems reviewed and are negative.       Objective    BP 113/74 (BP Location: Right Arm, Patient Position: Sitting, Cuff Size: Normal)   Pulse 88   Wt 93 lb 9.6 oz (42.5 kg)   LMP  11/15/2013   SpO2 98%   BMI 17.69 kg/m   Physical Exam Vitals and nursing note reviewed.  Constitutional:      General: She is not in acute distress. HENT:     Head: Normocephalic and atraumatic.     Right Ear: Tympanic membrane, ear canal and external ear normal.     Left Ear: Tympanic membrane, ear canal and external ear normal.     Nose: Nose normal.     Mouth/Throat:     Mouth: Mucous membranes are moist.     Pharynx: Oropharynx is clear.   Eyes:     Conjunctiva/sclera: Conjunctivae normal.     Pupils: Pupils are equal, round, and reactive to light.   Neck:     Thyroid : No thyromegaly.   Cardiovascular:     Rate and Rhythm: Normal rate and regular rhythm.     Heart sounds: Normal heart sounds. No murmur heard. Pulmonary:     Effort: Pulmonary effort is normal. No respiratory distress.     Breath sounds: Normal breath sounds.  Abdominal:     General: There is no distension.     Palpations: Abdomen is soft. There is no mass.     Tenderness: There is no abdominal tenderness.   Musculoskeletal:        General: Normal range of motion.     Cervical back: Normal range of motion and neck supple.   Skin:    General: Skin is warm and dry.   Neurological:     General: No focal deficit present.     Mental Status: She is alert and oriented to person, place, and time.   Psychiatric:        Mood and Affect: Mood normal.        Behavior: Behavior normal.         Assessment & Plan:   Annual physical exam -     Comprehensive metabolic panel with GFR  Screening for deficiency anemia -     CBC with Differential/Platelet  Screening for lipid disorders -     Lipid panel  Screening for endocrine/metabolic/immunity disorders -     Hemoglobin A1c -     TSH -     VITAMIN D  25 Hydroxy (Vit-D Deficiency, Fractures)  Need for hepatitis C screening test -     Hepatitis C antibody  Other orders -     amLODIPine  Besylate; Take 1 tablet (10 mg total) by mouth daily.   Dispense: 90 tablet; Refill: 1 -     Atorvastatin  Calcium ; Take 1 tablet (20 mg total)  by mouth daily.  Dispense: 90 tablet; Refill: 1     Return in about 6 months (around 08/03/2024).   Arlo Lama, MD

## 2024-02-03 ENCOUNTER — Encounter: Payer: Self-pay | Admitting: Family Medicine

## 2024-02-09 ENCOUNTER — Ambulatory Visit: Payer: Self-pay | Admitting: Family Medicine

## 2024-02-09 DIAGNOSIS — E785 Hyperlipidemia, unspecified: Secondary | ICD-10-CM

## 2024-03-07 ENCOUNTER — Other Ambulatory Visit: Payer: Self-pay | Admitting: Family Medicine

## 2024-03-07 ENCOUNTER — Other Ambulatory Visit: Payer: Self-pay | Admitting: *Deleted

## 2024-03-07 ENCOUNTER — Telehealth: Payer: Self-pay | Admitting: *Deleted

## 2024-03-07 DIAGNOSIS — E785 Hyperlipidemia, unspecified: Secondary | ICD-10-CM

## 2024-03-07 NOTE — Progress Notes (Unsigned)
Pa 

## 2024-03-07 NOTE — Telephone Encounter (Signed)
 Patient came in for a [facility] form to [facility] work

## 2024-03-07 NOTE — Addendum Note (Signed)
 Addended by: CELESTIA GUSTAV BRAVO on: 03/07/2024 10:09 AM   Modules accepted: Orders

## 2024-04-17 ENCOUNTER — Other Ambulatory Visit: Payer: Self-pay | Admitting: Family Medicine

## 2024-04-17 DIAGNOSIS — Z1231 Encounter for screening mammogram for malignant neoplasm of breast: Secondary | ICD-10-CM

## 2024-05-03 ENCOUNTER — Ambulatory Visit (INDEPENDENT_AMBULATORY_CARE_PROVIDER_SITE_OTHER): Admitting: Family Medicine

## 2024-05-03 ENCOUNTER — Encounter: Payer: Self-pay | Admitting: Family Medicine

## 2024-05-03 VITALS — BP 144/83 | HR 67 | Ht 61.0 in | Wt 90.8 lb

## 2024-05-03 DIAGNOSIS — I10 Essential (primary) hypertension: Secondary | ICD-10-CM | POA: Diagnosis not present

## 2024-05-03 DIAGNOSIS — R7989 Other specified abnormal findings of blood chemistry: Secondary | ICD-10-CM

## 2024-05-03 DIAGNOSIS — E785 Hyperlipidemia, unspecified: Secondary | ICD-10-CM

## 2024-05-03 MED ORDER — LOSARTAN POTASSIUM 50 MG PO TABS: 50.0000 mg | ORAL_TABLET | Freq: Every day | ORAL | 0 refills | Status: DC

## 2024-05-03 NOTE — Progress Notes (Unsigned)
 Established Patient Office Visit  Subjective    Patient ID: Laura Estes, female    DOB: 03-17-1971  Age: 53 y.o. MRN: [id number]  CC:  Chief Complaint  Patient presents with   Medical Management of Chronic Issues    HPI Laura Estes presents for hypertension. Patient has not taken meds for at least a week but denies acute complaints.   Outpatient Encounter Medications as of 05/03/2024  Medication Sig   amLODipine  (NORVASC ) 10 MG tablet Take 1 tablet (10 mg total) by mouth daily.   atorvastatin  (LIPITOR) 20 MG tablet Take 1 tablet (20 mg total) by mouth daily.   losartan  (COZAAR ) 50 MG tablet Take 1 tablet (50 mg total) by mouth daily.   ibuprofen  (ADVIL ) 600 MG tablet Take 1 tablet (600 mg total) by mouth every 6 (six) hours as needed for moderate pain. Take with food. (Patient not taking: Reported on 05/03/2024)   No facility-administered encounter medications on file as of 05/03/2024.    Past Medical History:  Diagnosis Date   Hypertension     History reviewed. No pertinent surgical history.  Family History  Problem Relation Age of Onset   Lung cancer Mother    Hypertension Father    Heart disease Father    Breast cancer Maternal Aunt 30   Cancer Maternal Grandmother        ovarian and uterine   Breast cancer Cousin        62s   Breast cancer Cousin        60s    Social History   Socioeconomic History   Marital status: Single    Spouse name: Not on file   Number of children: 0   Years of education: Not on file   Highest education level: Some college, no degree  Occupational History   Occupation: full time  Tobacco Use   Smoking status: Never   Smokeless tobacco: Never  Vaping Use   Vaping status: Never Used  Substance and Sexual Activity   Alcohol use: No    Alcohol/week: 0.0 standard drinks of alcohol   Drug use: No   Sexual activity: Not Currently    Birth control/protection: None  Other Topics Concern   Not on file  Social History Narrative    Lives alone   Left Handed   Drinks tea occassionally   Social Drivers of Health   Financial Resource Strain: Low Risk  (05/02/2024)   Overall Financial Resource Strain (CARDIA)    Difficulty of Paying Living Expenses: Not very hard  Food Insecurity: Food Insecurity Present (05/02/2024)   Hunger Vital Sign    Worried About Running Out of Food in the Last Year: Sometimes true    Ran Out of Food in the Last Year: Never true  Transportation Needs: No Transportation Needs (08/01/2023)   PRAPARE - Administrator, Civil Service (Medical): No    Lack of Transportation (Non-Medical): No  Physical Activity: Unknown (05/02/2024)   Exercise Vital Sign    Days of Exercise per Week: Patient declined    Minutes of Exercise per Session: Not on file  Stress: Stress Concern Present (05/02/2024)   [employer] of Occupational Health - Occupational Stress Questionnaire    Feeling of Stress: To some extent  Social Connections: Unknown (05/02/2024)   Social Connection and Isolation Panel    Frequency of Communication with Friends and Family: Patient declined    Frequency of Social Gatherings with Friends and Family: Patient declined  Attends Religious Services: Patient declined    Active Member of Clubs or Organizations: No    Attends Banker Meetings: Not on file    Marital Status: Never married  Intimate Partner Violence: Not At Risk (08/01/2023)   Humiliation, Afraid, Rape, and Kick questionnaire    Fear of Current or Ex-Partner: No    Emotionally Abused: No    Physically Abused: No    Sexually Abused: No    Review of Systems  All other systems reviewed and are negative.       Objective    BP (!) 144/83   Pulse 67   Ht 5' 1 (1.549 m)   Wt 90 lb 12.8 oz (41.2 kg)   LMP 11/15/2013   SpO2 98%   BMI 17.16 kg/m   Physical Exam Vitals and nursing note reviewed.  Constitutional:      General: She is not in acute distress. Cardiovascular:     Rate and  Rhythm: Normal rate and regular rhythm.  Pulmonary:     Effort: Pulmonary effort is normal.     Breath sounds: Normal breath sounds.  Neurological:     General: No focal deficit present.     Mental Status: She is alert and oriented to person, place, and time.  Psychiatric:        Mood and Affect: Mood normal.        Behavior: Behavior normal.         Assessment & Plan:   Essential hypertension  Hyperlipidemia, unspecified hyperlipidemia type  Abnormal TSH -     TSH + free T4  Other orders -     Losartan  Potassium; Take 1 tablet (50 mg total) by mouth daily.  Dispense: 90 tablet; Refill: 0     Return in about 6 weeks (around 06/14/2024) for follow up, chronic med issues.   Tanda Raguel SQUIBB, MD

## 2024-05-21 ENCOUNTER — Ambulatory Visit
Admission: RE | Admit: 2024-05-21 | Discharge: 2024-05-21 | Disposition: A | Source: Ambulatory Visit | Attending: Family Medicine | Admitting: Family Medicine

## 2024-05-21 ENCOUNTER — Telehealth: Payer: Self-pay | Admitting: Emergency Medicine

## 2024-05-21 DIAGNOSIS — Z1231 Encounter for screening mammogram for malignant neoplasm of breast: Secondary | ICD-10-CM

## 2024-05-21 NOTE — Telephone Encounter (Signed)
 Patient wants results from labs on 05/04/2024

## 2024-05-24 ENCOUNTER — Telehealth: Payer: Self-pay | Admitting: Family Medicine

## 2024-05-24 ENCOUNTER — Other Ambulatory Visit: Payer: Self-pay | Admitting: Family Medicine

## 2024-05-24 DIAGNOSIS — R928 Other abnormal and inconclusive findings on diagnostic imaging of breast: Secondary | ICD-10-CM

## 2024-05-24 NOTE — Telephone Encounter (Signed)
 Received. Signed by Dr. Tanda. [facility]

## 2024-05-24 NOTE — Telephone Encounter (Signed)
 I have attempted to contact this patient by phone with the following results: left message to return my call on answering machine.

## 2024-05-24 NOTE — Telephone Encounter (Signed)
 A document form from [facility] has been faxed: STAT for breast exam, to be filled out by provider. Send document back via Fax within 7-days to 14 days. Document is located in providers tray at front office.          Fax number: [phone]

## 2024-05-25 ENCOUNTER — Ambulatory Visit: Payer: Self-pay | Admitting: Family Medicine

## 2024-05-25 NOTE — Telephone Encounter (Signed)
 Attempted to contact pt. No answer, LVM to call back

## 2024-05-31 ENCOUNTER — Ambulatory Visit
Admission: RE | Admit: 2024-05-31 | Discharge: 2024-05-31 | Disposition: A | Discharge status: Home / Self Care | Source: Ambulatory Visit | Attending: Family Medicine | Admitting: Family Medicine

## 2024-05-31 DIAGNOSIS — R928 Other abnormal and inconclusive findings on diagnostic imaging of breast: Secondary | ICD-10-CM

## 2024-06-01 ENCOUNTER — Ambulatory Visit: Payer: Self-pay | Admitting: Family Medicine

## 2024-06-01 NOTE — Progress Notes (Signed)
 Attempted to call patient. No answer, LVM to call back regarding lab results.

## 2024-06-14 ENCOUNTER — Ambulatory Visit (INDEPENDENT_AMBULATORY_CARE_PROVIDER_SITE_OTHER): Admitting: Family Medicine

## 2024-06-14 ENCOUNTER — Encounter: Payer: Self-pay | Admitting: Family Medicine

## 2024-06-14 VITALS — BP 133/79 | HR 68 | Ht 61.0 in | Wt 91.6 lb

## 2024-06-14 DIAGNOSIS — E038 Other specified hypothyroidism: Secondary | ICD-10-CM

## 2024-06-14 DIAGNOSIS — R7989 Other specified abnormal findings of blood chemistry: Secondary | ICD-10-CM

## 2024-06-18 ENCOUNTER — Encounter: Payer: Self-pay | Admitting: Family Medicine

## 2024-06-18 ENCOUNTER — Telehealth: Payer: Self-pay | Admitting: Family Medicine

## 2024-06-18 NOTE — Progress Notes (Signed)
 Established Patient Office Visit  Subjective    Patient ID: Laura Estes, female    DOB: 1970-09-11  Age: 53 y.o. MRN: [id number]  CC:  Chief Complaint  Patient presents with   Medical Management of Chronic Issues    Pt reports thyroid  concerns     HPI Nakeia Calvi presents with complaint of abnormal TSH. Patient reports   Outpatient Encounter Medications as of 06/14/2024  Medication Sig   atorvastatin  (LIPITOR) 20 MG tablet Take 1 tablet (20 mg total) by mouth daily.   losartan  (COZAAR ) 50 MG tablet Take 1 tablet (50 mg total) by mouth daily.   amLODipine  (NORVASC ) 10 MG tablet Take 1 tablet (10 mg total) by mouth daily. (Patient not taking: Reported on 06/14/2024)   ibuprofen  (ADVIL ) 600 MG tablet Take 1 tablet (600 mg total) by mouth every 6 (six) hours as needed for moderate pain. Take with food. (Patient not taking: Reported on 05/03/2024)   No facility-administered encounter medications on file as of 06/14/2024.    Past Medical History:  Diagnosis Date   Hypertension     History reviewed. No pertinent surgical history.  Family History  Problem Relation Age of Onset   Lung cancer Mother    Hypertension Father    Heart disease Father    Breast cancer Maternal Aunt 30   Cancer Maternal Grandmother        ovarian and uterine   Breast cancer Cousin        66s   Breast cancer Cousin        69s    Social History   Socioeconomic History   Marital status: Single    Spouse name: Not on file   Number of children: 0   Years of education: Not on file   Highest education level: Some college, no degree  Occupational History   Occupation: full time  Tobacco Use   Smoking status: Never   Smokeless tobacco: Never  Vaping Use   Vaping status: Never Used  Substance and Sexual Activity   Alcohol use: No    Alcohol/week: 0.0 standard drinks of alcohol   Drug use: No   Sexual activity: Not Currently    Birth control/protection: None  Other Topics Concern   Not  on file  Social History Narrative   Lives alone   Left Handed   Drinks tea occassionally   Social Drivers of Health   Financial Resource Strain: Low Risk  (05/02/2024)   Overall Financial Resource Strain (CARDIA)    Difficulty of Paying Living Expenses: Not very hard  Food Insecurity: Food Insecurity Present (05/02/2024)   Hunger Vital Sign    Worried About Running Out of Food in the Last Year: Sometimes true    Ran Out of Food in the Last Year: Never true  Transportation Needs: No Transportation Needs (08/01/2023)   PRAPARE - Administrator, Civil Service (Medical): No    Lack of Transportation (Non-Medical): No  Physical Activity: Inactive (06/14/2024)   Exercise Vital Sign    Days of Exercise per Week: 0 days    Minutes of Exercise per Session: 0 min  Stress: Stress Concern Present (05/02/2024)   [employer] of Occupational Health - Occupational Stress Questionnaire    Feeling of Stress: To some extent  Social Connections: Socially Isolated (06/14/2024)   Social Connection and Isolation Panel    Frequency of Communication with Friends and Family: Never    Frequency of Social Gatherings with Friends and Family: Never  Attends Religious Services: More than 4 times per year    Active Member of Clubs or Organizations: No    Attends Banker Meetings: Never    Marital Status: Never married  Intimate Partner Violence: Not At Risk (08/01/2023)   Humiliation, Afraid, Rape, and Kick questionnaire    Fear of Current or Ex-Partner: No    Emotionally Abused: No    Physically Abused: No    Sexually Abused: No    ROS      Objective    BP 133/79   Pulse 68   Ht 5' 1 (1.549 m)   Wt 91 lb 9.6 oz (41.5 kg)   LMP 11/15/2013   SpO2 95%   BMI 17.31 kg/m   Physical Exam Vitals and nursing note reviewed.  Constitutional:      General: She is not in acute distress. Neck:     Thyroid : No thyromegaly.  Cardiovascular:     Rate and Rhythm: Normal  rate and regular rhythm.  Pulmonary:     Effort: Pulmonary effort is normal.     Breath sounds: Normal breath sounds.  Musculoskeletal:     Cervical back: Normal range of motion and neck supple. No rigidity or tenderness.  Neurological:     General: No focal deficit present.     Mental Status: She is alert and oriented to person, place, and time.  Psychiatric:        Mood and Affect: Mood normal.        Behavior: Behavior normal.         Assessment & Plan:   Abnormal TSH -     Ambulatory referral to Endocrinology   Referral to endo for further eval/mgt  No follow-ups on file.   Tanda Raguel SQUIBB, MD

## 2024-06-18 NOTE — Telephone Encounter (Signed)
 Copied from CRM [id number]. Topic: Referral - Status >> Jun 18, 2024 11:08 AM Dedra B wrote: Reason for CRM: Pt called to follow up on her endocrinology referral.

## 2024-06-20 NOTE — Telephone Encounter (Signed)
 Called pt and she stated she has been contacted by them and is scheduled for 12/5

## 2024-07-24 ENCOUNTER — Other Ambulatory Visit (HOSPITAL_COMMUNITY): Payer: Self-pay | Admitting: Endocrinology

## 2024-07-24 DIAGNOSIS — E0590 Thyrotoxicosis, unspecified without thyrotoxic crisis or storm: Secondary | ICD-10-CM

## 2024-07-27 ENCOUNTER — Other Ambulatory Visit: Payer: Self-pay | Admitting: Family Medicine

## 2024-08-06 ENCOUNTER — Encounter (HOSPITAL_COMMUNITY)
Admission: RE | Admit: 2024-08-06 | Discharge: 2024-08-06 | Disposition: A | Discharge status: Home / Self Care | Source: Ambulatory Visit | Attending: Endocrinology | Admitting: Endocrinology

## 2024-08-06 ENCOUNTER — Ambulatory Visit (HOSPITAL_COMMUNITY)
Admission: RE | Admit: 2024-08-06 | Discharge: 2024-08-06 | Disposition: A | Discharge status: Home / Self Care | Source: Ambulatory Visit | Attending: Endocrinology | Admitting: Endocrinology

## 2024-08-06 DIAGNOSIS — E0590 Thyrotoxicosis, unspecified without thyrotoxic crisis or storm: Secondary | ICD-10-CM | POA: Diagnosis present

## 2024-08-06 MED ORDER — SODIUM IODIDE I-123 7.4 MBQ CAPS
432.2000 | ORAL_CAPSULE | Freq: Once | ORAL | Status: AC
  Administered 2024-08-06: 432.2 via ORAL

## 2024-08-07 ENCOUNTER — Ambulatory Visit (HOSPITAL_COMMUNITY)
Admission: RE | Admit: 2024-08-07 | Discharge: 2024-08-07 | Disposition: A | Discharge status: Home / Self Care | Source: Ambulatory Visit | Attending: Endocrinology | Admitting: Endocrinology
